# Patient Record
Sex: Female | Born: 1965 | Race: White | Hispanic: No | Marital: Married | Smoking: Current every day smoker
Health system: Southern US, Community
[De-identification: ages and names within clinical notes are randomized; demographics above are authoritative.]

## PROBLEM LIST (undated history)

## (undated) DIAGNOSIS — Z0000 Encounter for general adult medical examination without abnormal findings: Secondary | ICD-10-CM

## (undated) DIAGNOSIS — L301 Dyshidrosis [pompholyx]: Secondary | ICD-10-CM

## (undated) DIAGNOSIS — M797 Fibromyalgia: Secondary | ICD-10-CM

## (undated) DIAGNOSIS — I10 Essential (primary) hypertension: Secondary | ICD-10-CM

## (undated) DIAGNOSIS — Z862 Personal history of diseases of the blood and blood-forming organs and certain disorders involving the immune mechanism: Secondary | ICD-10-CM

## (undated) DIAGNOSIS — G2581 Restless legs syndrome: Secondary | ICD-10-CM

## (undated) DIAGNOSIS — M1990 Unspecified osteoarthritis, unspecified site: Secondary | ICD-10-CM

## (undated) DIAGNOSIS — L509 Urticaria, unspecified: Secondary | ICD-10-CM

## (undated) DIAGNOSIS — F329 Major depressive disorder, single episode, unspecified: Secondary | ICD-10-CM

## (undated) HISTORY — DX: Restless legs syndrome: G25.81

## (undated) HISTORY — PX: CHOLECYSTECTOMY: SHX55

## (undated) HISTORY — PX: TUBAL LIGATION: SHX77

## (undated) HISTORY — DX: Major depressive disorder, single episode, unspecified: F32.9

## (undated) HISTORY — DX: Urticaria, unspecified: L50.9

## (undated) HISTORY — DX: Encounter for general adult medical examination without abnormal findings: Z00.00

## (undated) HISTORY — DX: Essential (primary) hypertension: I10

## (undated) HISTORY — DX: Personal history of diseases of the blood and blood-forming organs and certain disorders involving the immune mechanism: Z86.2

## (undated) HISTORY — DX: Dyshidrosis (pompholyx): L30.1

## (undated) HISTORY — DX: Fibromyalgia: M79.7

## (undated) HISTORY — DX: Unspecified osteoarthritis, unspecified site: M19.90

---

## 2001-03-11 ENCOUNTER — Emergency Department (HOSPITAL_COMMUNITY): Admission: EM | Admit: 2001-03-11 | Discharge: 2001-03-11 | Payer: Self-pay | Admitting: Emergency Medicine

## 2006-10-27 ENCOUNTER — Emergency Department (HOSPITAL_COMMUNITY): Admission: EM | Admit: 2006-10-27 | Discharge: 2006-10-27 | Payer: Self-pay | Admitting: Family Medicine

## 2010-01-03 ENCOUNTER — Emergency Department (HOSPITAL_COMMUNITY): Admission: EM | Admit: 2010-01-03 | Discharge: 2010-01-03 | Payer: Self-pay | Admitting: Emergency Medicine

## 2011-01-09 LAB — URINALYSIS, ROUTINE W REFLEX MICROSCOPIC
Specific Gravity, Urine: 1.027 (ref 1.005–1.030)
Urobilinogen, UA: 1 mg/dL (ref 0.0–1.0)

## 2011-01-09 LAB — CBC
HCT: 41.6 % (ref 36.0–46.0)
Hemoglobin: 13.9 g/dL (ref 12.0–15.0)
MCHC: 33.4 g/dL (ref 30.0–36.0)
MCV: 102.6 fL — ABNORMAL HIGH (ref 78.0–100.0)
Platelets: 176 10*3/uL (ref 150–400)
RBC: 4.06 MIL/uL (ref 3.87–5.11)
RDW: 16 % — ABNORMAL HIGH (ref 11.5–15.5)
WBC: 7.8 10*3/uL (ref 4.0–10.5)

## 2011-01-09 LAB — POCT I-STAT, CHEM 8
Calcium, Ion: 1.15 mmol/L (ref 1.12–1.32)
Glucose, Bld: 101 mg/dL — ABNORMAL HIGH (ref 70–99)
Hemoglobin: 10.9 g/dL — ABNORMAL LOW (ref 12.0–15.0)
Potassium: 3.7 mEq/L (ref 3.5–5.1)

## 2011-01-09 LAB — DIFFERENTIAL
Eosinophils Relative: 0 % (ref 0–5)
Lymphocytes Relative: 25 % (ref 12–46)
Lymphs Abs: 1.9 10*3/uL (ref 0.7–4.0)
Neutrophils Relative %: 71 % (ref 43–77)

## 2011-04-19 ENCOUNTER — Emergency Department (HOSPITAL_COMMUNITY)
Admission: EM | Admit: 2011-04-19 | Discharge: 2011-04-19 | Disposition: A | Discharge status: Home / Self Care | Payer: Self-pay | Attending: Emergency Medicine | Admitting: Emergency Medicine

## 2011-04-19 DIAGNOSIS — L299 Pruritus, unspecified: Secondary | ICD-10-CM | POA: Insufficient documentation

## 2011-04-19 DIAGNOSIS — M129 Arthropathy, unspecified: Secondary | ICD-10-CM | POA: Insufficient documentation

## 2011-04-19 DIAGNOSIS — L259 Unspecified contact dermatitis, unspecified cause: Secondary | ICD-10-CM | POA: Insufficient documentation

## 2011-04-19 DIAGNOSIS — Z79899 Other long term (current) drug therapy: Secondary | ICD-10-CM | POA: Insufficient documentation

## 2011-04-19 DIAGNOSIS — IMO0001 Reserved for inherently not codable concepts without codable children: Secondary | ICD-10-CM | POA: Insufficient documentation

## 2011-04-19 DIAGNOSIS — I10 Essential (primary) hypertension: Secondary | ICD-10-CM | POA: Insufficient documentation

## 2011-04-19 DIAGNOSIS — M79609 Pain in unspecified limb: Secondary | ICD-10-CM | POA: Insufficient documentation

## 2011-04-19 DIAGNOSIS — M7989 Other specified soft tissue disorders: Secondary | ICD-10-CM | POA: Insufficient documentation

## 2011-04-21 ENCOUNTER — Emergency Department (HOSPITAL_COMMUNITY)
Admission: EM | Admit: 2011-04-21 | Discharge: 2011-04-21 | Disposition: A | Discharge status: Home / Self Care | Payer: Self-pay | Attending: Emergency Medicine | Admitting: Emergency Medicine

## 2011-04-21 DIAGNOSIS — L259 Unspecified contact dermatitis, unspecified cause: Secondary | ICD-10-CM | POA: Insufficient documentation

## 2011-04-21 DIAGNOSIS — IMO0001 Reserved for inherently not codable concepts without codable children: Secondary | ICD-10-CM | POA: Insufficient documentation

## 2011-04-21 DIAGNOSIS — R21 Rash and other nonspecific skin eruption: Secondary | ICD-10-CM | POA: Insufficient documentation

## 2011-04-21 DIAGNOSIS — L298 Other pruritus: Secondary | ICD-10-CM | POA: Insufficient documentation

## 2011-04-21 DIAGNOSIS — L2989 Other pruritus: Secondary | ICD-10-CM | POA: Insufficient documentation

## 2011-04-21 DIAGNOSIS — I10 Essential (primary) hypertension: Secondary | ICD-10-CM | POA: Insufficient documentation

## 2011-04-21 DIAGNOSIS — L509 Urticaria, unspecified: Secondary | ICD-10-CM | POA: Insufficient documentation

## 2011-05-15 ENCOUNTER — Emergency Department (HOSPITAL_COMMUNITY)
Admission: EM | Admit: 2011-05-15 | Discharge: 2011-05-15 | Disposition: A | Discharge status: Home / Self Care | Payer: Self-pay | Attending: Emergency Medicine | Admitting: Emergency Medicine

## 2011-05-15 ENCOUNTER — Emergency Department (HOSPITAL_COMMUNITY): Payer: Self-pay

## 2011-05-15 DIAGNOSIS — L259 Unspecified contact dermatitis, unspecified cause: Secondary | ICD-10-CM | POA: Insufficient documentation

## 2011-05-15 DIAGNOSIS — I10 Essential (primary) hypertension: Secondary | ICD-10-CM | POA: Insufficient documentation

## 2011-05-15 DIAGNOSIS — X500XXA Overexertion from strenuous movement or load, initial encounter: Secondary | ICD-10-CM | POA: Insufficient documentation

## 2011-05-15 DIAGNOSIS — M25579 Pain in unspecified ankle and joints of unspecified foot: Secondary | ICD-10-CM | POA: Insufficient documentation

## 2011-05-15 DIAGNOSIS — S93409A Sprain of unspecified ligament of unspecified ankle, initial encounter: Secondary | ICD-10-CM | POA: Insufficient documentation

## 2011-06-23 ENCOUNTER — Encounter: Payer: Self-pay | Admitting: Family Medicine

## 2011-06-23 DIAGNOSIS — I10 Essential (primary) hypertension: Secondary | ICD-10-CM

## 2011-06-23 DIAGNOSIS — M797 Fibromyalgia: Secondary | ICD-10-CM | POA: Insufficient documentation

## 2011-06-23 DIAGNOSIS — M1990 Unspecified osteoarthritis, unspecified site: Secondary | ICD-10-CM | POA: Insufficient documentation

## 2011-06-23 DIAGNOSIS — G2581 Restless legs syndrome: Secondary | ICD-10-CM | POA: Insufficient documentation

## 2011-06-23 HISTORY — DX: Essential (primary) hypertension: I10

## 2011-06-24 ENCOUNTER — Ambulatory Visit (INDEPENDENT_AMBULATORY_CARE_PROVIDER_SITE_OTHER): Payer: Self-pay | Admitting: Family Medicine

## 2011-06-24 ENCOUNTER — Encounter: Payer: Self-pay | Admitting: Family Medicine

## 2011-06-24 DIAGNOSIS — IMO0001 Reserved for inherently not codable concepts without codable children: Secondary | ICD-10-CM

## 2011-06-24 DIAGNOSIS — M129 Arthropathy, unspecified: Secondary | ICD-10-CM

## 2011-06-24 DIAGNOSIS — F3289 Other specified depressive episodes: Secondary | ICD-10-CM

## 2011-06-24 DIAGNOSIS — M797 Fibromyalgia: Secondary | ICD-10-CM

## 2011-06-24 DIAGNOSIS — L259 Unspecified contact dermatitis, unspecified cause: Secondary | ICD-10-CM

## 2011-06-24 DIAGNOSIS — G2581 Restless legs syndrome: Secondary | ICD-10-CM

## 2011-06-24 DIAGNOSIS — L309 Dermatitis, unspecified: Secondary | ICD-10-CM

## 2011-06-24 DIAGNOSIS — F32A Depression, unspecified: Secondary | ICD-10-CM

## 2011-06-24 DIAGNOSIS — F329 Major depressive disorder, single episode, unspecified: Secondary | ICD-10-CM

## 2011-06-24 DIAGNOSIS — I10 Essential (primary) hypertension: Secondary | ICD-10-CM

## 2011-06-24 DIAGNOSIS — L301 Dyshidrosis [pompholyx]: Secondary | ICD-10-CM

## 2011-06-24 DIAGNOSIS — M1990 Unspecified osteoarthritis, unspecified site: Secondary | ICD-10-CM

## 2011-06-24 HISTORY — DX: Depression, unspecified: F32.A

## 2011-06-24 HISTORY — DX: Dyshidrosis (pompholyx): L30.1

## 2011-06-24 MED ORDER — AMITRIPTYLINE HCL 25 MG PO TABS: 25.0000 mg | ORAL_TABLET | Freq: Every day | ORAL | Status: DC

## 2011-06-24 MED ORDER — TRIAMCINOLONE ACETONIDE 0.5 % EX OINT: TOPICAL_OINTMENT | Freq: Two times a day (BID) | CUTANEOUS | Status: DC

## 2011-06-24 MED ORDER — LISINOPRIL-HYDROCHLOROTHIAZIDE 20-12.5 MG PO TABS: 1.0000 | ORAL_TABLET | Freq: Every day | ORAL | Status: DC

## 2011-06-24 NOTE — Assessment & Plan Note (Signed)
Will treat with TCA (amitriptyline) as patient has been out of cymbalta and it is too expensive for her.  Will also benefit fibromyalgia. Consider phq9 at next visit.

## 2011-06-24 NOTE — Assessment & Plan Note (Signed)
KOH scraping negative for hyphae or other signs of fungal infection. Will treat with Triamcinolone Ointment twice a day with gloves overnight to keep moisture in. Protective barrier for hands and feet in the daytime.will see back in 1 month.

## 2011-06-24 NOTE — Assessment & Plan Note (Addendum)
Well controlled with occasional carbidopa/levodopa. Will continue for now-no refills needed.   Addendum 06/27/11 7:20 pm: spoke with patient during her son's appointment today and she said she is out of her carbidopa/levodopa. Will refill at this time.

## 2011-06-24 NOTE — Assessment & Plan Note (Addendum)
Primarily in knees-occasionally uses OTC to give relief but most of the time just deals with pain.

## 2011-06-24 NOTE — Patient Instructions (Addendum)
It was a pleasure to meet you today! I have refilled your blood pressure medication. We have started you on an alternative to Cymbalta to try to help with depression and fibromyalgia.   I am sorry you are having so much pain with your rash. Please use the Triamcinolone Ointment twice a day. Be sure to use gloves overnight to keep moisture in. Use a protective barrier for your hands and feet in the daytime. We would like to see you back in 1 month.   Please try to obtain records from [facility] and have them sent to our office in the next week.   Please try to exercise 4-5 days a week for up to 30 minutes as tolerated. Going for a walk with your husband is a great idea!

## 2011-06-24 NOTE — Assessment & Plan Note (Addendum)
Treated with Cymbalta in the past (currently out-patient given samples previously) but patient cannot afford. Will switch to Amitriptyline given patient's difficulty sleeping-start at 25mg  and slowly titrate up.  Patient also states she was on tramadol at previous clinic. Will not refill at this time given change to TCA. Will await records to make determination on tramadol.   Also advised exercize 4-5x/wk (walking ok) as this is an effective fibromyalgia treatment.

## 2011-06-24 NOTE — Assessment & Plan Note (Signed)
Will refill lisinopril/hctz. Recheck blood pressure in a month as patient has been off of medications.

## 2011-06-24 NOTE — Progress Notes (Signed)
  Subjective:    Patient ID: Traci Guzman, female    DOB: 12-Oct-1966, 45 y.o.   MRN: [id number]  HPI Patient is 45 year old female with a history of HTN, Fibromyalgia, and dyshidrotic eczema  presenting as a new patient. Patient is also here for refills as she has been out of blood pressure and depression medications.   1. The patient's primary complaint is worsening of her eczema on her palms and soles. She says that over the course of the summer, her skin has been particularly sensitive. An area will itch without a rash then she will scratch it. After this time, the area becomes erythematous with some hives. The skin on her palms and soles has been sensitive as well. The plaques from her dyshidrotic eczema swell and become very painful (10/10 pain in her feet today).She describes the pain as her "skin being on fire". It is always present when the rash is present/inflamed.  She says she thought the worsening rashes/itching were poison oak. She was treated at [facility] with prednisone and it went completely away a month or two ago. She says 3 weeks ago she quit eating gluten and that things have improved slightly since then. Benadryl 4x/day has not been that beneficial. 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 She says she has had thedyshidrotic eczema since that time but not this bad of a flare previously.   2. HTN-has been off medications for a month or so. Needs a refill on Lisinopril/HCTZ. Says well controlled on this regimen. Denies HA, CP, SOB, edema.   3. Depression/-patient says she cannot afford Cymbalta and would like a cheaper option that will help both with her depression and fibromyalgia.   Past medical, social, family history-reviewed and documented. Currently not smoking-quit 08  Review of Systems-see HPI     Objective:   Physical Exam  Vitals reviewed. Constitutional: She is oriented to person, place, and time. She appears  well-developed.  HENT:  Head: Normocephalic and atraumatic.  Nose: Nose normal.  Mouth/Throat: Oropharynx is clear and moist. No oropharyngeal exudate.  Eyes: Conjunctivae and EOM are normal. Pupils are equal, round, and reactive to light. Right eye exhibits no discharge. Left eye exhibits no discharge. No scleral icterus.  Neck: Normal range of motion. Neck supple.  Cardiovascular: Normal rate, regular rhythm, normal heart sounds and intact distal pulses.  Exam reveals no gallop and no friction rub.   No murmur heard. Pulmonary/Chest: Effort normal and breath sounds normal. No respiratory distress. She has no wheezes. She has no rales. She exhibits no tenderness.  Abdominal: Soft. Bowel sounds are normal. She exhibits no distension. There is no tenderness. There is no rebound and no guarding.  Musculoskeletal: Normal range of motion.  Neurological: She is alert and oriented to person, place, and time.  Skin: Skin is warm and dry.  Psychiatric: She has a normal mood and affect. Her behavior is normal.          Assessment & Plan:

## 2011-06-27 MED ORDER — CARBIDOPA-LEVODOPA 25-100 MG PO TABS: 1.0000 | ORAL_TABLET | Freq: Every evening | ORAL | Status: DC | PRN

## 2011-06-27 NOTE — Progress Notes (Signed)
Addended by: Shelva Majestic on: 06/27/2011 07:24 PM   Modules accepted: Orders

## 2011-08-10 ENCOUNTER — Ambulatory Visit (INDEPENDENT_AMBULATORY_CARE_PROVIDER_SITE_OTHER): Payer: Self-pay | Admitting: Family Medicine

## 2011-08-10 ENCOUNTER — Encounter: Payer: Self-pay | Admitting: Family Medicine

## 2011-08-10 VITALS — BP 102/73 | HR 101 | Ht 66.0 in | Wt 223.0 lb

## 2011-08-10 DIAGNOSIS — L509 Urticaria, unspecified: Secondary | ICD-10-CM | POA: Insufficient documentation

## 2011-08-10 DIAGNOSIS — M797 Fibromyalgia: Secondary | ICD-10-CM

## 2011-08-10 DIAGNOSIS — IMO0001 Reserved for inherently not codable concepts without codable children: Secondary | ICD-10-CM

## 2011-08-10 DIAGNOSIS — R21 Rash and other nonspecific skin eruption: Secondary | ICD-10-CM

## 2011-08-10 HISTORY — DX: Urticaria, unspecified: L50.9

## 2011-08-10 MED ORDER — PREDNISONE (PAK) 10 MG PO TABS: 10.0000 mg | ORAL_TABLET | Freq: Every day | ORAL | Status: AC

## 2011-08-10 NOTE — Patient Instructions (Signed)
I would like to try the steroids to see if this helps your rash.  I would like for you to follow up in one week to see if this is improving.  I would like for you to take 50mg  of amitriptyline at bedtime to see if this helps to control your pain better.  If you feel like your rash is worsening please give [place] a call back sooner.

## 2011-08-10 NOTE — Progress Notes (Signed)
  Subjective:    Patient ID: Traci Guzman, female    DOB: 08-03-1966, 45 y.o.   MRN: [id number]  HPI 45yo female who continues to complain of a rash all over her body. She states that the rash started 3 months ago her feet and has continued to spread randomly on her body. She does not remember anything significant occuring when the rash appeared. The rash itches and causes a piercing pain. It also comes and goes. She also states that a new rash has appeared on her right arm since sitting in the examining room. She has had no changes in food or ingredients other than cutting out gluten 1 month ago. She has no new detergents or soaps.  She reports no history of travel. She has started amitriptyline and discontinued cymbalta 1 month ago, but no other medication changes. No one in her household has experienced the rash. She denies any recent travel. She denies fever, chills, headache, joint pain. She has tried benadryl, and various NSAIDS without relief. She also states that her eczema has improved on her hands and feet since starting Kenalog She states that her fibromyalgia is not under control and would consider increasing amytriptyline   Review of Systems See HPI    Objective:   Physical Exam General: Cooperative, well appearing, NAD Chest: S1, S2 heard, RRR Lungs: CTAB Skin: Hive-like raised rash on the medial aspect of upper right arm and elbow over medial epicondyle, faded rash on her back. Dry eczematous patches on her palms and soles. Tender feet at dorsal aspect, and heel. No swelling noted       Assessment & Plan:

## 2011-08-15 NOTE — Assessment & Plan Note (Addendum)
Rash looks to be urticarial, given itchiness and waxing and waning nature, unsure of etiology.  Does not look to be shingles. No new foods, spices, travel or changes in detergents.  Will treat with prednisone taper, to see if this improves.  Given red flags, will come back if not improving.

## 2011-08-15 NOTE — Assessment & Plan Note (Signed)
Does not seem to have good control, still with complaints of pain in her feet.  Will increase amitriptyline to see if this helps better control her symptoms.

## 2011-08-17 ENCOUNTER — Ambulatory Visit: Payer: Self-pay | Admitting: Family Medicine

## 2011-08-23 ENCOUNTER — Telehealth: Payer: Self-pay | Admitting: Family Medicine

## 2011-08-23 DIAGNOSIS — M797 Fibromyalgia: Secondary | ICD-10-CM

## 2011-08-23 MED ORDER — AMITRIPTYLINE HCL 50 MG PO TABS: ORAL_TABLET | ORAL | Status: DC

## 2011-08-23 NOTE — Telephone Encounter (Signed)
Consulted with Dr. Jennette Kettle and she advises may send in Amitriptyline 50 mg tab . Patient has follow up appointment 08/31/2011  Patient notified.

## 2011-08-23 NOTE — Telephone Encounter (Signed)
Pt was here a few weeks ago and Dr Ashley Royalty increased her Amitriptilyne to 50mg .  She is now out b/c the pharmacy will not give refill on the old one.  Needs to know what to do. [facility] [address]

## 2011-08-31 ENCOUNTER — Encounter: Payer: Self-pay | Admitting: Family Medicine

## 2011-08-31 ENCOUNTER — Ambulatory Visit (INDEPENDENT_AMBULATORY_CARE_PROVIDER_SITE_OTHER): Payer: Self-pay | Admitting: Family Medicine

## 2011-08-31 VITALS — BP 130/78 | HR 96 | Temp 98.5°F | Ht 66.0 in | Wt 218.7 lb

## 2011-08-31 DIAGNOSIS — L509 Urticaria, unspecified: Secondary | ICD-10-CM

## 2011-08-31 DIAGNOSIS — L301 Dyshidrosis [pompholyx]: Secondary | ICD-10-CM

## 2011-08-31 DIAGNOSIS — F329 Major depressive disorder, single episode, unspecified: Secondary | ICD-10-CM

## 2011-08-31 DIAGNOSIS — IMO0001 Reserved for inherently not codable concepts without codable children: Secondary | ICD-10-CM

## 2011-08-31 DIAGNOSIS — I10 Essential (primary) hypertension: Secondary | ICD-10-CM

## 2011-08-31 DIAGNOSIS — Z862 Personal history of diseases of the blood and blood-forming organs and certain disorders involving the immune mechanism: Secondary | ICD-10-CM

## 2011-08-31 DIAGNOSIS — Z23 Encounter for immunization: Secondary | ICD-10-CM

## 2011-08-31 DIAGNOSIS — M797 Fibromyalgia: Secondary | ICD-10-CM

## 2011-08-31 HISTORY — DX: Personal history of diseases of the blood and blood-forming organs and certain disorders involving the immune mechanism: Z86.2

## 2011-08-31 LAB — BASIC METABOLIC PANEL
BUN: 11 mg/dL (ref 6–23)
CO2: 30 mEq/L (ref 19–32)
Chloride: 102 mEq/L (ref 96–112)
Potassium: 4.4 mEq/L (ref 3.5–5.3)

## 2011-08-31 LAB — CBC
MCH: 28 pg (ref 26.0–34.0)
MCHC: 32 g/dL (ref 30.0–36.0)
MCV: 87.5 fL (ref 78.0–100.0)
Platelets: 350 10*3/uL (ref 150–400)
RBC: 4.32 MIL/uL (ref 3.87–5.11)
RDW: 15.9 % — ABNORMAL HIGH (ref 11.5–15.5)
WBC: 7.7 10*3/uL (ref 4.0–10.5)

## 2011-08-31 LAB — VITAMIN B12: Vitamin B-12: 662 pg/mL (ref 211–911)

## 2011-08-31 MED ORDER — GABAPENTIN 300 MG PO CAPS: 300.0000 mg | ORAL_CAPSULE | Freq: Every day | ORAL | Status: DC

## 2011-08-31 NOTE — Patient Instructions (Signed)
It was good to see you again, Traci Guzman.  To review what we talked about:  1. I will place a referral for dermatology for you, sometimes these take a while on the orange card.   2. You got your flu and tetanus shot.   3. Please try to obtain records for you and your husband.   4. I am going to prescribe you a new medication for your pain. Gabapentin. I want you to take 1 pill at night for 2 weeks then increase to taking it in the morning and evening.   -the best thing for your fibromyalgia is going to exercise so please try to walk with your husband.   5. I am going to check several labs on you as well today.   6. You can schedule a follow up when your husband follows up and we can talk about your lab results at that time.   Thanks, Dr. Durene Cal

## 2011-09-01 ENCOUNTER — Encounter: Payer: Self-pay | Admitting: Family Medicine

## 2011-09-01 MED ORDER — CETIRIZINE HCL 10 MG PO CHEW: 10.0000 mg | CHEWABLE_TABLET | Freq: Every day | ORAL | Status: DC

## 2011-09-01 NOTE — Assessment & Plan Note (Addendum)
CBC with hgb 12.1 and b12 wnl.

## 2011-09-01 NOTE — Assessment & Plan Note (Signed)
Moderate control today at 130/78. Cr 0.77 Estimated Creatinine Clearance: 105.6 ml/min (by C-G formula based on Cr of 0.77).

## 2011-09-01 NOTE — Assessment & Plan Note (Addendum)
Does not seem to have good control even with increase amitriptyline to 50. Will start amitriptyline and titrate up. 300mg  qhs 2 weeks then BID. F/u in 1 month.   Attempting to control pain so patient can exercise which will be the #1 thing that can help this patient.

## 2011-09-01 NOTE — Assessment & Plan Note (Signed)
Improved with gluten free diet. Minimally with triamcinolone cream. Patient to continue gluten free diet. Should continue to moisturize hands and feet.

## 2011-09-01 NOTE — Assessment & Plan Note (Addendum)
Had told patient that we would place dermatology referral. After patient left, realized we have not yet tried an antihistamine. Will place an order for cetirizine h1 antihistamineas this is first line for chronic urticaria per uptodate. Will see back in 1 month and consider dermatology referral or further advancing therapy at that time. Called patient to update on plan and on lab work.

## 2011-09-01 NOTE — Assessment & Plan Note (Signed)
On Amitriptyline. Previously on cymbalta. Needs phq9 next visit so we can trend.

## 2011-09-01 NOTE — Progress Notes (Signed)
  Subjective:    Patient ID: Traci Guzman, female    DOB: 08-Feb-1966, 45 y.o.   MRN: [id number]  HPI Patient is 45 year old female with a history of HTN, Fibromyalgia, depression, dyshidrotic eczema, and urticarial rash  presenting for follow up of skin complaints.  1. Dyshidrotic eczema on palms and soles-patient tried triamcinolone cream in gloves overnight with very little effect. She then tried a gluten free diet which she says improved her discomfort/pain/dryness by 90%. The patient's primary complaint is worsening of her eczema on her palms and soles.   2. Urticarial Rash-Has been going on for almost 6 months, since the beginning of the summer. When I saw the patient last time, An area would itch without rash, she would scratch it then the area becomes erythematous with some hives. She was treated at [facility] with prednisone and it went completely several months ago. She was also treated by our clinic with a prednisone burst which completely improved her rash/pain. Benadryl helps somewhat with itching. Since the 2nd prednisone taper, the patient's urticarial rash has been primarily on her trunk (previously on arms and legs) .  She describes the pain as her "skin being on fire" with pain 6/10. Gluten free diet did not help this rash as much as dyshidrotic eczema.   3. Fibromyalgia-continued pain (low back, mid back, shoulder blades, neck, Left hip)despite starting amitriptyline and titration up to 50mg  at last visit. Interested in  gabapentin 300mg . Has not exercised as prescribed due to pain.     4. Patient reported hx anemia and b12 deficiency. Also reports history of some borderline sugars. Fasting today.  Wants to know what her labs look like currently.   5. Health Maintenance-patient wants a flu shot. Willing to have a tdap. Had normal pap 2 years ago with no history of abnormal paps.   Past medical-Currently not smoking-quit 08  Review of Systems-see HPI     Objective:   Physical Exam   BP 130/78  Pulse 96  Temp(Src) 98.5 F (36.9 C) (Oral)  Ht 5\' 6"  (1.676 m)  Wt 218 lb 11.2 oz (99.202 kg)  BMI 35.30 kg/m2  LMP 08/22/2011 Gen:  NAD HEENT: moist mucous membranes CV: Regular rate and rhythm, no murmurs rubs or gallops PULM: clear to auscultation bilaterally. No wheezes/rales/rhonchi ABD: soft/nontender/nondistended/normal bowel sounds EXT: No edema Skin: Hive-like raised rash band like below bra line. Dermatographism present on right arm after patient scratched with hive like appearance.  Dry eczematous patches on her palms and soles improved from previous without cracking or blistering. Neuro: Alert and oriented x3    Assessment & Plan:

## 2011-09-23 ENCOUNTER — Encounter: Payer: Self-pay | Admitting: Family Medicine

## 2011-09-23 ENCOUNTER — Ambulatory Visit (INDEPENDENT_AMBULATORY_CARE_PROVIDER_SITE_OTHER): Payer: Self-pay | Admitting: Family Medicine

## 2011-09-23 ENCOUNTER — Other Ambulatory Visit: Payer: Self-pay | Admitting: Family Medicine

## 2011-09-23 VITALS — BP 146/82 | HR 93 | Temp 97.8°F | Ht 66.0 in | Wt 220.0 lb

## 2011-09-23 DIAGNOSIS — M797 Fibromyalgia: Secondary | ICD-10-CM

## 2011-09-23 DIAGNOSIS — IMO0001 Reserved for inherently not codable concepts without codable children: Secondary | ICD-10-CM

## 2011-09-23 DIAGNOSIS — I10 Essential (primary) hypertension: Secondary | ICD-10-CM

## 2011-09-23 DIAGNOSIS — F32A Depression, unspecified: Secondary | ICD-10-CM

## 2011-09-23 DIAGNOSIS — L509 Urticaria, unspecified: Secondary | ICD-10-CM

## 2011-09-23 DIAGNOSIS — L301 Dyshidrosis [pompholyx]: Secondary | ICD-10-CM

## 2011-09-23 DIAGNOSIS — F329 Major depressive disorder, single episode, unspecified: Secondary | ICD-10-CM

## 2011-09-23 MED ORDER — AMITRIPTYLINE HCL 75 MG PO TABS: ORAL_TABLET | ORAL | Status: DC

## 2011-09-23 MED ORDER — MELOXICAM 15 MG PO TABS: 15.0000 mg | ORAL_TABLET | Freq: Every day | ORAL | Status: DC

## 2011-09-23 NOTE — Progress Notes (Signed)
  Subjective:    Patient ID: Traci Guzman, female    DOB: 11-Feb-1966, 45 y.o.   MRN: [id number]  HPI  Patient is 45 year old female with a history of HTN, Fibromyalgia, depression, dyshidrotic eczema, and urticarial rash  presenting for follow up of multiple issues, primarily fibromyalgia  1. Dyshidrotic eczema on palms and soles-gluten free diet continues to help patient have minimal issue with eczema.   2. Urticarial Rash-much improved with cetirizine. Patient will occasionally have an irritated area on her arm but MUCH less extensive than previous. She is happy with the result. Gluten free diet helped this minimally.   3. Fibromyalgia-continued pain (low back, mid back, shoulder blades, neck, Left hip, now in right hand)-started gabapentin 300mg  qhs last visit but when tried to titrate to BID, patient became to tired during the day. Willing to increase amitriptyline. Also taking ibuprofen 800mg  q6 hours. Has been walking 15 minutes a day 3x per week.   4. Depression-amitriptyline/gabapentin combo has made the patient's sleep much improved. She says she sleeps through the night and does not feel groggy. She says overall her spirits have improved on current medication compared to cymbalta. PHQ9 of 6. 2 pts for overeating.   Past medical-Currently not smoking-quit 08  Review of Systems negative except as noted in HPI       Objective:   Physical Exam  BP 146/82  Pulse 93  Temp(Src) 97.8 F (36.6 C) (Oral)  Ht 5\' 6"  (1.676 m)  Wt 220 lb (99.791 kg)  BMI 35.51 kg/m2  LMP 08/22/2011 Gen:  NAD HEENT: moist mucous membranes CV: Regular rate and rhythm, no murmurs rubs or gallops PULM: clear to auscultation bilaterally. No wheezes/rales/rhonchi EXT: No edema. Moves x4.  Skin: No current signs of previous Hive-like. Dermatographism minimal. Dry eczematous patches on her palms and soles without cracking or blistering (improved from initial visit) Neuro: Alert and oriented x3      Assessment & Plan:

## 2011-09-23 NOTE — Assessment & Plan Note (Signed)
Cetirizine with drastic improvement.-continue.

## 2011-09-23 NOTE — Assessment & Plan Note (Addendum)
On amitriptyline 50mg , now titrated to 75 mg. Continue to titrate up to 150mg .  Also gabapentin 300mg  qhs. Goal exercise 15 minutes 3x/wk. Also given mobic and told no more tylenol to ease regimen. Patient can also take tylenol.

## 2011-09-23 NOTE — Assessment & Plan Note (Signed)
Poor control today. Will recheck at next visit as previously well controlled and patient with 2 granddaughters that seemed to raise anxiety and pulse level.

## 2011-09-23 NOTE — Assessment & Plan Note (Signed)
phq9 score of 6 on 09/23/11. Patient reports improved spirits on amitriptyline as compared to previous cymbalta. Also more cost effective.

## 2011-09-23 NOTE — Assessment & Plan Note (Signed)
Continue gluten free diet as patient reports drastically helped.

## 2011-09-23 NOTE — Patient Instructions (Signed)
It was great to see you again , Ms. Traci Guzman.   I am sorry your fibromyalgia pains are continuing.  1. I am going to increase your amitriptyline to 75mg  (I am glad this is helping your depression as well). We will continue to increase this dosage until we have improvement in your symptoms. Continue your exercise walking 15 minutes per day 3 days per week.  2. I sent in a prescription for a pain medicine that you can take once a day. Do not take ibuprofen while taking this. You can take Tylenol.  3. We will not increase your gabapentin right now since it is making you sleepy during the day.   For your other problems: 1. I am glad your skin issues have improved so much. Plese continue your current medications.   I would like to see you in a month or two to see if we need to increase your amitriptyline more,  Dr. Durene Cal

## 2011-09-23 NOTE — Telephone Encounter (Signed)
Refill request

## 2011-10-03 ENCOUNTER — Telehealth: Payer: Self-pay | Admitting: Family Medicine

## 2011-10-03 NOTE — Telephone Encounter (Signed)
Ms. Traci Guzman is calling because she isn't sure if Dr. Durene Cal will want to see her for some of the symptoms like stiffness that the Mobic isn't working on.  It works great immflammation and joint, but not for muscle pain.

## 2011-10-03 NOTE — Telephone Encounter (Signed)
Patient states still having severe muscle pain with fibromyalgia. States she has had a few times where she has even sat down and cried from the pain.   Told patient to continue to take mobic and tylenol prn. Also will continue heating pad. Will also try ice packs.  Patient has had more relief with this than tylenol. Told her it would be a slow steady process of titrating up her amitriptyline. Patient expresses understanding.  At next visit, will titrate up and consider flexeril for pain control especially at night as this is a grade A recommendation from [employer].

## 2011-10-26 ENCOUNTER — Encounter: Payer: Self-pay | Admitting: Family Medicine

## 2011-10-26 ENCOUNTER — Ambulatory Visit (INDEPENDENT_AMBULATORY_CARE_PROVIDER_SITE_OTHER): Payer: Self-pay | Admitting: Family Medicine

## 2011-10-26 VITALS — BP 128/80 | HR 108 | Temp 98.0°F | Ht 66.0 in | Wt 219.0 lb

## 2011-10-26 DIAGNOSIS — I10 Essential (primary) hypertension: Secondary | ICD-10-CM

## 2011-10-26 DIAGNOSIS — L509 Urticaria, unspecified: Secondary | ICD-10-CM

## 2011-10-26 DIAGNOSIS — F329 Major depressive disorder, single episode, unspecified: Secondary | ICD-10-CM

## 2011-10-26 DIAGNOSIS — M797 Fibromyalgia: Secondary | ICD-10-CM

## 2011-10-26 DIAGNOSIS — F32A Depression, unspecified: Secondary | ICD-10-CM

## 2011-10-26 DIAGNOSIS — IMO0001 Reserved for inherently not codable concepts without codable children: Secondary | ICD-10-CM

## 2011-10-26 DIAGNOSIS — L301 Dyshidrosis [pompholyx]: Secondary | ICD-10-CM

## 2011-10-26 MED ORDER — AMITRIPTYLINE HCL 100 MG PO TABS: ORAL_TABLET | ORAL | Status: DC

## 2011-10-26 MED ORDER — TRAMADOL HCL 50 MG PO TABS: 50.0000 mg | ORAL_TABLET | Freq: Three times a day (TID) | ORAL | Status: DC | PRN

## 2011-10-26 MED ORDER — CETIRIZINE HCL 10 MG PO CHEW: 10.0000 mg | CHEWABLE_TABLET | Freq: Two times a day (BID) | ORAL | Status: DC

## 2011-10-26 NOTE — Assessment & Plan Note (Signed)
Will titrate amitriptyline up to 100 mg. Will also add tramadol 50 mg 3 times a day when necessary. Patient to continue Mobic. Also Tylenol when necessary. Encouraged patient to pick up activity level again back to at least 15 minutes per day 3 times a week of exercise.

## 2011-10-26 NOTE — Progress Notes (Signed)
  Subjective:    Patient ID: Traci Guzman, female    DOB: 1965-10-23, 46 y.o.   MRN: [id number]  HPIPatient is 46 year old female with a history of HTN, Fibromyalgia, depression, dyshidrotic eczema, and urticarial rash  presenting for follow up of multiple issues, primarily fibromyalgia and worsening of urticarial rash  1. Dyshidrotic eczema on palms and soles-gluten free diet continues to help patient have minimal issue with eczema. Patient states she should have moisturized today but otherwise happy with progress.   2. Urticarial Rash-initially much improved with cetirizine. She says for 1 week she has had worsening of her symptoms with urticaria along her bra line and in other areas when irritated by contact.. She had an area on her left wrist which when it got a hive, it actually caused some joint pain. This also happened in her right ankle yesterday. No fever/chills/reported new sexual partners. Joint aches for less than 24 hours and only seem associated with urticarial rash.   3. Fibromyalgia-continued pain (low back, mid back, shoulder blades, neck, Left hip, now in right hand)-on gabapentin and amitriptyline now at 75mg . Also taking mobic and tylenol. Patient says pain level similar to last visit. She has not been exercising in the busy holiday season, previously had been walking 15 minutes a day 3x per week.   4. Depression-amitriptyline/gabapentin combo continues to help patient with sleep and mood. Says her spirits are bright despite her pain.    Past medical-Currently not smoking-quit 08   Review of Systemsnegative except as noted in HPI. Patient does report some cough, congestion, runny nose in recent days as well but not a major concern for patient.      Objective:   Physical Exam  Vitals reviewed. Constitutional: She is oriented to person, place, and time. She appears well-developed and well-nourished. No distress.  HENT:  Mouth/Throat: Oropharynx is clear and moist. No  oropharyngeal exudate.  Eyes: Pupils are equal, round, and reactive to light.  Cardiovascular: Normal rate and regular rhythm.  Exam reveals no gallop and no friction rub.   No murmur heard. Pulmonary/Chest: Effort normal and breath sounds normal. No respiratory distress. She has no wheezes. She has no rales. She exhibits no tenderness.  Abdominal: Soft. She exhibits no distension.  Musculoskeletal: Normal range of motion.       Some tenderness to palpation on soft tissues proximal to left wrist, some erythema also noted in this area. Also mild tenderness to palpation on dorsal aspect of right ankle associated with area of erythema. No decreased ROM in either joint.   Neurological: She is alert and oriented to person, place, and time.  Skin:       10x10 cm urticarial rash noted on LUE in fold of arm.           Assessment & Plan:

## 2011-10-26 NOTE — Assessment & Plan Note (Signed)
Most recent PhQ 9 of 6. Patient continues to be in good spirits. She is thrilled that she is able to sleep through the night with her medicines. No complaints at this time. Continue current plan.

## 2011-10-26 NOTE — Assessment & Plan Note (Addendum)
Worsened symptoms x 2 week. 2 areas overlying joints concerning patient for joint pain but seem associated with urticarial rash which has always been painful for patient.   Increased to cetirizine BID.   If not effective in future, start h2 antihistamine. Then, derm referral.

## 2011-10-26 NOTE — Patient Instructions (Addendum)
For your rash worsening in the last week, I would like for you to start taking your Cetirizine twice daily instead of once daily.   For your fibromyalgia, i have increased your amitriptyline to 100mg .   I would like to see you back in 1 month to see how you are doing with these. If you begin to have fevers, worsening joint pain, worsening rash while on this regimen, I would like for you to be seen sooner.   Your blood pressure was fine on recheck today, so we can just continue to follow that.   Sorry you aren't feeling well, Dr. Durene Cal

## 2011-10-26 NOTE — Assessment & Plan Note (Addendum)
Here for blood pressure follow up. Blood pressure not elevated on repeat exam once patient at rest. No side effects from medicine. Will continue current medicines.

## 2011-10-26 NOTE — Assessment & Plan Note (Signed)
Well-controlled on gluten-free diet. Still present on exam the patient is happy with her current results.

## 2011-11-04 ENCOUNTER — Other Ambulatory Visit: Payer: Self-pay | Admitting: Family Medicine

## 2011-11-04 NOTE — Telephone Encounter (Signed)
Refill request

## 2011-11-24 ENCOUNTER — Ambulatory Visit: Payer: Self-pay | Admitting: Family Medicine

## 2011-12-06 ENCOUNTER — Ambulatory Visit: Payer: Self-pay | Admitting: Family Medicine

## 2011-12-30 ENCOUNTER — Other Ambulatory Visit: Payer: Self-pay | Admitting: Family Medicine

## 2012-01-01 NOTE — Telephone Encounter (Signed)
Refilled Mobic for fibromyalgia.

## 2012-01-01 NOTE — Telephone Encounter (Signed)
Refill request

## 2012-01-11 ENCOUNTER — Telehealth: Payer: Self-pay | Admitting: Family Medicine

## 2012-01-11 DIAGNOSIS — M797 Fibromyalgia: Secondary | ICD-10-CM

## 2012-01-11 NOTE — Telephone Encounter (Signed)
Patient needs refill on Amitriptyline and was told by [facility] on [address] that she needs to see her MD first.  The first opening isn't until late April, she is scheduled and would like enough to last at least until the appt.  She is also requesting a refill on Tramadol.

## 2012-01-12 NOTE — Telephone Encounter (Signed)
Patient is calling back  °

## 2012-01-16 MED ORDER — AMITRIPTYLINE HCL 100 MG PO TABS: ORAL_TABLET | ORAL | Status: DC

## 2012-01-16 MED ORDER — TRAMADOL HCL 50 MG PO TABS: 50.0000 mg | ORAL_TABLET | Freq: Three times a day (TID) | ORAL | Status: DC | PRN

## 2012-01-16 NOTE — Telephone Encounter (Signed)
Refilled both Tramadol and Amitriptyline. Will ask admin to update patient as I was on vacation last week, thus the reason refills were delayed.

## 2012-01-16 NOTE — Telephone Encounter (Signed)
Patient is calling back because she hasn't heard anything about the refill yet.  Her pharmacy has attempted several times over the last several days.  She is also needing Tramadol.

## 2012-02-07 ENCOUNTER — Ambulatory Visit: Payer: Self-pay | Admitting: Family Medicine

## 2012-02-08 ENCOUNTER — Other Ambulatory Visit: Payer: Self-pay | Admitting: Family Medicine

## 2012-02-08 NOTE — Telephone Encounter (Signed)
Refilled gabapentin and lisinopril-hctz.

## 2012-02-20 ENCOUNTER — Encounter: Payer: Self-pay | Admitting: Family Medicine

## 2012-02-20 ENCOUNTER — Ambulatory Visit (HOSPITAL_COMMUNITY)
Admission: RE | Admit: 2012-02-20 | Discharge: 2012-02-20 | Disposition: A | Discharge status: Home / Self Care | Payer: Self-pay | Source: Ambulatory Visit | Attending: Family Medicine | Admitting: Family Medicine

## 2012-02-20 ENCOUNTER — Ambulatory Visit (INDEPENDENT_AMBULATORY_CARE_PROVIDER_SITE_OTHER): Payer: Self-pay | Admitting: Family Medicine

## 2012-02-20 VITALS — BP 132/79 | HR 105 | Temp 98.3°F | Ht 66.0 in | Wt 209.0 lb

## 2012-02-20 DIAGNOSIS — IMO0001 Reserved for inherently not codable concepts without codable children: Secondary | ICD-10-CM

## 2012-02-20 DIAGNOSIS — R002 Palpitations: Secondary | ICD-10-CM | POA: Insufficient documentation

## 2012-02-20 DIAGNOSIS — M129 Arthropathy, unspecified: Secondary | ICD-10-CM

## 2012-02-20 DIAGNOSIS — M1990 Unspecified osteoarthritis, unspecified site: Secondary | ICD-10-CM

## 2012-02-20 DIAGNOSIS — M797 Fibromyalgia: Secondary | ICD-10-CM

## 2012-02-20 DIAGNOSIS — E669 Obesity, unspecified: Secondary | ICD-10-CM

## 2012-02-20 HISTORY — DX: Palpitations: R00.2

## 2012-02-20 MED ORDER — AMITRIPTYLINE HCL 25 MG PO TABS: 25.0000 mg | ORAL_TABLET | Freq: Every day | ORAL | Status: DC

## 2012-02-20 MED ORDER — AMITRIPTYLINE HCL 100 MG PO TABS: ORAL_TABLET | ORAL | Status: DC

## 2012-02-20 NOTE — Patient Instructions (Addendum)
Dear Traci Guzman,   It was great to see you today. Thank you for coming to clinic. Please read below regarding the issues that we discussed.   1. For your heart palpitations, we did an EKG today which was normal. I would also like to check a thyroid level today. At this time, I do not see anything worrisome related to your exam or symptoms. Please continue to monitor.  2. For your fibromyalgia, we have increased your amitriptyline to 125mg  (take a 100mg  tablet and a 25 mg tablet each night). I am thrilled you are now able to get up and do so much walking. You can keep taking your tramadol, mobic, and tylenol as needed. Hopefully, we can get you off of Mobic at a future visit.  3. Great job on the exercise. You have lost 16 lbs since we first met. Keep up the great work.  4. Finally, your blood pressure was well controlled today.   Please follow up in clinic in 6 months . Please call earlier if you have any questions or concerns.   Sincerely,  Dr. Tana Conch  Serotonin Syndrome Serotonin is a brain chemical that regulates the nervous system. Some kinds of drugs increase the amount of serotonin in your body. Drugs that increase the serotonin in your body include:   Anti-depressant medications.   St. John's wort.   Recreational drugs.   Migraine medicines.   Some pain medicines.  SYMPTOMS Combining these drugs increases the risk that you will become ill with a toxic condition called serotonin syndrome.  Symptoms of too much serotonin include:  Confusion.   Agitation.   Weakness.   Insomnia.   Fever.   Sweats.  Other symptoms that may develop include:  Shakiness.   Muscle spasms.   Seizures.  TREATMENT  Hospital treatment is often needed until the effects are controlled.   Avoiding the combination of medicines listed above is recommended.   Check with your doctor if you are concerned about your medicine or the side effects.  Document Released: 11/10/2004  Document Revised: 09/22/2011 Document Reviewed: 10/03/2005 [facility] Patient Information 2012 [place], [place].

## 2012-02-21 DIAGNOSIS — E669 Obesity, unspecified: Secondary | ICD-10-CM | POA: Insufficient documentation

## 2012-02-21 NOTE — Assessment & Plan Note (Signed)
Encouraged patient to continue to exercise with walking each day.

## 2012-02-21 NOTE — Assessment & Plan Note (Signed)
EKG in office with normal sinus rhythm. No abnormal heart rhythm noted on my physical exam either. No red flags. Possibly related to anxiety/panic attacks given other co morbidities in patient. TSH ordered and came back slightly low. Plan to follow up with T3, T4 at next visit. Patient with weight loss but likely due to increased exercise not necessarily a hyperthyroid state.

## 2012-02-21 NOTE — Assessment & Plan Note (Signed)
Achieving exercise goal. Still with pain. APpears OA may be worst pain. Patient still with pain in regular fibromyalgia areas and ready to titrate up to 125mg . When see patient back, plan to titrate to max of 150. Hope to be able to wean Mobic at that time.

## 2012-02-21 NOTE — Assessment & Plan Note (Signed)
Per fibromyalgia regimen for treatment of knee pain. Mobic and tylenol prn.

## 2012-02-21 NOTE — Progress Notes (Signed)
  Subjective:    Patient ID: Traci Guzman, female    DOB: December 18, 1965, 46 y.o.   MRN: [id number]  HPI 1. Heart palpitations-says has had them for years but worse in recent months. Last 15-20 minutes and "stop her in her tracks" from worry but not from any physical symptom. No associated CP, SOB, neck pain, sweating, nausea, vomiting. Happens randomly-not particularly associated with exertion. Does admit to chest pain occasionally but says this lasts for about 1 second and goes away and not associated with above symptoms.   2. Fibromyalgia-continues to have pain in trouble areas (see most recent note for list of areas). Thinks pain in knees from OA likely worse than Fibromyalgia pain. ABle to get up and move now and is walking 15 minutes 5x per day when previously could not do it 3x per week. Patient thinks pain is slightly worse but that mobility is much better. SHe is excited about her progress.   3. Obesity-has lost 16 lbs since September. Also lifting light hand weights in addition to walking mentioned above.   4. HTN-well controlled. Compliant with meds. ROS essentially obtained in heart palpitations section with following addition-no edema, HA, blurry vision.   5. Recent URI/sinus symptoms-used antibiotics after several days of sinus pressure. Symptoms now resolving. COunseled against using nonprescription specific antibiotics in future.    Review of Systems -See HPI  Past Medical History-smoking status noted: former smoker. Reviewed problem list.  Medications- reviewed and updated Chief complaint-noted      Objective:   Physical Exam  Constitutional: She is oriented to person, place, and time. She appears well-developed and well-nourished. No distress.  HENT:  Head: Normocephalic and atraumatic.  Right Ear: Tympanic membrane and ear canal normal.  Left Ear: Tympanic membrane and ear canal normal.  Nose: Nose normal.  Mouth/Throat: Oropharynx is clear and moist. No oropharyngeal  exudate.       slight maxillary sinus tenderness on left side.   Eyes: Conjunctivae and EOM are normal. Pupils are equal, round, and reactive to light. Right eye exhibits no discharge. Left eye exhibits no discharge.  Neck: Normal range of motion. Neck supple.  Cardiovascular: Normal rate, regular rhythm and intact distal pulses.  Exam reveals no gallop and no friction rub.   No murmur heard. Pulmonary/Chest: Breath sounds normal. No respiratory distress. She has no wheezes. She has no rales.  Abdominal: Soft. Bowel sounds are normal.  Musculoskeletal: Normal range of motion. She exhibits no edema.  Lymphadenopathy:    She has no cervical adenopathy.  Neurological: She is alert and oriented to person, place, and time.  Skin: Skin is warm and dry.      Assessment & Plan:  Antibiotic misuse-patient recently with URI symptoms and she took 3 days of old antibiotics-advised patient against future use of antibiotics when not specifically prescribed for. Patient aware and ready to make change in future.

## 2012-02-27 ENCOUNTER — Telehealth: Payer: Self-pay | Admitting: Family Medicine

## 2012-02-27 DIAGNOSIS — R002 Palpitations: Secondary | ICD-10-CM

## 2012-02-27 NOTE — Telephone Encounter (Signed)
Pt informed of the below.    Jeannett Senior, Please put in a referral to [facility] for a heart monitor.  We have to send them over there to get these placed. Romney Compean, Maryjo Rochester

## 2012-02-27 NOTE — Telephone Encounter (Signed)
Left voicemail for patient to return call.   For nursing to relay: 1.  Thyroid levels slightly low (meaning high functioning thyroid) So I would like to repeat in 6 weeks.  2. Discussed with preceptors and would like to place a Holter monitor to see if we can capture any of the events due to Ms. Skowron's age and HTN.  I would like for her to remember how many events she has of palpitations and times while wearing Holter monitor.   Tana Conch, MD, PGY1 02/27/2012 11:00 AM

## 2012-02-27 NOTE — Telephone Encounter (Signed)
Addended by: Damita Lack on: 02/27/2012 02:40 PM   Modules accepted: Orders

## 2012-02-27 NOTE — Telephone Encounter (Signed)
Addended by: Shelva Majestic on: 02/27/2012 12:28 PM   Modules accepted: Orders

## 2012-03-26 ENCOUNTER — Other Ambulatory Visit: Payer: Self-pay | Admitting: *Deleted

## 2012-03-26 DIAGNOSIS — M797 Fibromyalgia: Secondary | ICD-10-CM

## 2012-03-26 MED ORDER — TRAMADOL HCL 50 MG PO TABS: 50.0000 mg | ORAL_TABLET | Freq: Three times a day (TID) | ORAL | Status: DC | PRN

## 2012-03-26 NOTE — Telephone Encounter (Signed)
Refilled tramadol.

## 2012-03-27 ENCOUNTER — Other Ambulatory Visit: Payer: Self-pay | Admitting: *Deleted

## 2012-03-27 DIAGNOSIS — M797 Fibromyalgia: Secondary | ICD-10-CM

## 2012-03-27 MED ORDER — GABAPENTIN 300 MG PO CAPS: 300.0000 mg | ORAL_CAPSULE | Freq: Every day | ORAL | Status: DC

## 2012-03-27 NOTE — Telephone Encounter (Signed)
Refilled gabapentin

## 2012-04-13 ENCOUNTER — Other Ambulatory Visit: Payer: Self-pay | Admitting: *Deleted

## 2012-04-13 DIAGNOSIS — I10 Essential (primary) hypertension: Secondary | ICD-10-CM

## 2012-04-13 DIAGNOSIS — M797 Fibromyalgia: Secondary | ICD-10-CM

## 2012-04-13 MED ORDER — LISINOPRIL-HYDROCHLOROTHIAZIDE 20-12.5 MG PO TABS: 1.0000 | ORAL_TABLET | Freq: Every day | ORAL | Status: DC

## 2012-04-13 NOTE — Telephone Encounter (Signed)
Refilled lisinopril/hctz

## 2012-04-13 NOTE — Telephone Encounter (Signed)
#  30 is only a 10 day supply per pharmacy.  Do you want to increase quantity?  Traci Guzman

## 2012-04-14 MED ORDER — TRAMADOL HCL 50 MG PO TABS: 50.0000 mg | ORAL_TABLET | Freq: Three times a day (TID) | ORAL | Status: DC | PRN

## 2012-04-14 NOTE — Telephone Encounter (Signed)
Refilled tramadol. Quantity to #90

## 2012-04-26 ENCOUNTER — Encounter (INDEPENDENT_AMBULATORY_CARE_PROVIDER_SITE_OTHER): Payer: Self-pay

## 2012-04-26 DIAGNOSIS — R002 Palpitations: Secondary | ICD-10-CM

## 2012-05-08 ENCOUNTER — Encounter: Payer: Self-pay | Admitting: Family Medicine

## 2012-07-16 ENCOUNTER — Other Ambulatory Visit: Payer: Self-pay | Admitting: Family Medicine

## 2012-08-29 ENCOUNTER — Other Ambulatory Visit: Payer: Self-pay | Admitting: Family Medicine

## 2012-08-30 ENCOUNTER — Other Ambulatory Visit: Payer: Self-pay | Admitting: *Deleted

## 2012-08-30 DIAGNOSIS — M797 Fibromyalgia: Secondary | ICD-10-CM

## 2012-08-30 MED ORDER — AMITRIPTYLINE HCL 100 MG PO TABS: ORAL_TABLET | ORAL | Status: DC

## 2012-09-04 ENCOUNTER — Other Ambulatory Visit: Payer: Self-pay | Admitting: Family Medicine

## 2012-10-01 ENCOUNTER — Other Ambulatory Visit: Payer: Self-pay | Admitting: Family Medicine

## 2012-10-01 ENCOUNTER — Other Ambulatory Visit: Payer: Self-pay | Admitting: *Deleted

## 2012-10-27 ENCOUNTER — Encounter (HOSPITAL_COMMUNITY): Payer: Self-pay | Admitting: Emergency Medicine

## 2012-10-27 ENCOUNTER — Emergency Department (HOSPITAL_COMMUNITY)
Admission: EM | Admit: 2012-10-27 | Discharge: 2012-10-27 | Disposition: A | Discharge status: Home / Self Care | Payer: Self-pay | Attending: Emergency Medicine | Admitting: Emergency Medicine

## 2012-10-27 ENCOUNTER — Emergency Department (HOSPITAL_COMMUNITY): Payer: Self-pay

## 2012-10-27 DIAGNOSIS — Y92009 Unspecified place in unspecified non-institutional (private) residence as the place of occurrence of the external cause: Secondary | ICD-10-CM | POA: Insufficient documentation

## 2012-10-27 DIAGNOSIS — Z862 Personal history of diseases of the blood and blood-forming organs and certain disorders involving the immune mechanism: Secondary | ICD-10-CM | POA: Insufficient documentation

## 2012-10-27 DIAGNOSIS — M79603 Pain in arm, unspecified: Secondary | ICD-10-CM

## 2012-10-27 DIAGNOSIS — M7918 Myalgia, other site: Secondary | ICD-10-CM

## 2012-10-27 DIAGNOSIS — X500XXA Overexertion from strenuous movement or load, initial encounter: Secondary | ICD-10-CM | POA: Insufficient documentation

## 2012-10-27 DIAGNOSIS — S46909A Unspecified injury of unspecified muscle, fascia and tendon at shoulder and upper arm level, unspecified arm, initial encounter: Secondary | ICD-10-CM | POA: Insufficient documentation

## 2012-10-27 DIAGNOSIS — S4980XA Other specified injuries of shoulder and upper arm, unspecified arm, initial encounter: Secondary | ICD-10-CM | POA: Insufficient documentation

## 2012-10-27 DIAGNOSIS — I10 Essential (primary) hypertension: Secondary | ICD-10-CM | POA: Insufficient documentation

## 2012-10-27 DIAGNOSIS — Z8739 Personal history of other diseases of the musculoskeletal system and connective tissue: Secondary | ICD-10-CM | POA: Insufficient documentation

## 2012-10-27 DIAGNOSIS — R29898 Other symptoms and signs involving the musculoskeletal system: Secondary | ICD-10-CM | POA: Insufficient documentation

## 2012-10-27 DIAGNOSIS — Z87891 Personal history of nicotine dependence: Secondary | ICD-10-CM | POA: Insufficient documentation

## 2012-10-27 DIAGNOSIS — Y9389 Activity, other specified: Secondary | ICD-10-CM | POA: Insufficient documentation

## 2012-10-27 DIAGNOSIS — Z79899 Other long term (current) drug therapy: Secondary | ICD-10-CM | POA: Insufficient documentation

## 2012-10-27 DIAGNOSIS — Z872 Personal history of diseases of the skin and subcutaneous tissue: Secondary | ICD-10-CM | POA: Insufficient documentation

## 2012-10-27 MED ORDER — OXYCODONE-ACETAMINOPHEN 5-325 MG PO TABS
1.0000 | ORAL_TABLET | Freq: Once | ORAL | Status: AC
  Administered 2012-10-27: 1 via ORAL
  Filled 2012-10-27: qty 1

## 2012-10-27 MED ORDER — CYCLOBENZAPRINE HCL 10 MG PO TABS: 10.0000 mg | ORAL_TABLET | Freq: Two times a day (BID) | ORAL | Status: DC | PRN

## 2012-10-27 MED ORDER — IBUPROFEN 400 MG PO TABS
800.0000 mg | ORAL_TABLET | Freq: Once | ORAL | Status: AC
  Administered 2012-10-27: 800 mg via ORAL
  Filled 2012-10-27: qty 2

## 2012-10-27 NOTE — ED Provider Notes (Signed)
History   This chart was scribed for non-physician practitioner working with Hurman Horn, MD by Smitty Pluck. This patient was seen in room TR07C/TR07C and the patient's care was started at 9:53 PM.   CSN: [id number]  Arrival date & time 10/27/12  1926       Chief Complaint  Patient presents with  . Arm Injury    (Consider location/radiation/quality/duration/timing/severity/associated sxs/prior treatment) Patient is a 47 y.o. female presenting with arm injury. The history is provided by the patient. No language interpreter was used.  Arm Injury  The incident occurred more than 2 days ago. The incident occurred at home. The injury mechanism was a pulled limb. There is an injury to the right shoulder. The pain is moderate. It is unlikely that a foreign body is present. Pertinent negatives include no nausea, no vomiting and no weakness. There have been no prior injuries to these areas. She is right-handed. She has been behaving normally.   Traci Guzman is a 47 y.o. female who presents to the Emergency Department complaining of constant, moderate right arm pain onset 4 days ago. She reports that she tried to catch great aunt while she was falling and felt pop in right arm. Movement of right arm aggravates pain. Pt reports that she has taken tramadol with minor relief. She denies hx of similar symptom, numbness, weakness and any other pain.   Past Medical History  Diagnosis Date  . Restless leg syndrome   . Arthritis     primarily knees  . Fibromyalgia   . Hypertension   . Dyshidrotic foot dermatitis   . Healthcare maintenance     pap 2010, mammogram 2004  . Dyshidrotic foot dermatitis   . Depression 06/24/2011  . 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 History of anemia 08/31/2011  . HTN (hypertension) 06/23/2011  . 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 Cesarean section     2x-1985 and 1988  . Cholecystectomy     1995  . Tubal ligation     1988    Family History  Problem Relation Age of Onset  . Diabetes Mother     age 10    History  Substance Use Topics  . Smoking status: Former Smoker -- 1.0 packs/day for 26 years    Types: Cigarettes    Quit date: 06/06/2011  . Smokeless tobacco: Not on file  . Alcohol Use: 0.6 oz/week    1 Glasses of wine per week    OB History    Grav Para Term Preterm Abortions TAB SAB Ect Mult Living                  Review of Systems  Constitutional: Negative for fever and chills.  Respiratory: Negative for shortness of breath.   Gastrointestinal: Negative for nausea and vomiting.  Musculoskeletal:       R humerous pain  Neurological: Negative for weakness.  All other systems reviewed and are negative.    Allergies  Dairy aid and Milk-related compounds  Home Medications   Current Outpatient Rx  Name  Route  Sig  Dispense  Refill  . AMITRIPTYLINE HCL 100 MG PO TABS   Oral   Take 100 mg by mouth at bedtime. Takes with a 25 mg tab to total 125 mg daily         . AMITRIPTYLINE HCL 25 MG PO TABS   Oral   Take 25  mg by mouth at bedtime. Takes with 100 mg tab to total 125 mg daily         . CARBIDOPA-LEVODOPA 25-100 MG PO TABS   Oral   Take 1 tablet by mouth daily as needed. For restless leg syndrome         . CETIRIZINE HCL 10 MG PO TABS   Oral   Take 10 mg by mouth daily.         Marland Kitchen GABAPENTIN 300 MG PO CAPS   Oral   Take 1 capsule (300 mg total) by mouth at bedtime.   30 capsule   5   . LISINOPRIL-HYDROCHLOROTHIAZIDE 20-12.5 MG PO TABS   Oral   Take 1 tablet by mouth daily.   30 tablet   11   . TRAMADOL HCL 50 MG PO TABS   Oral   Take 50 mg by mouth every 8 (eight) hours as needed. For pain           BP 127/83  Pulse 101  Temp 98.5 F (36.9 C) (Oral)  Resp 14  SpO2 99%  LMP 10/18/2012  Physical Exam  Nursing note and vitals reviewed. Constitutional: She is oriented to person, place, and time. She appears  well-developed and well-nourished. No distress.  HENT:  Head: Normocephalic and atraumatic.  Eyes: EOM are normal.  Neck: Neck supple. No tracheal deviation present.  Cardiovascular: Normal rate.   Pulmonary/Chest: Effort normal. No respiratory distress.  Musculoskeletal: Normal range of motion. She exhibits tenderness (right anterior shoulder). She exhibits no edema.       Tenderness to midshaft humerous on the R after injury.  No swelling noted.  +cms below injury. No long trips.   Neurological: She is alert and oriented to person, place, and time. She has normal strength. No sensory deficit.  Skin: Skin is warm and dry.  Psychiatric: She has a normal mood and affect. Her behavior is normal.    ED Course  Procedures (including critical care time) DIAGNOSTIC STUDIES: Oxygen Saturation is 99% on room air, normal by my interpretation.    COORDINATION OF CARE: 9:55 PM Discussed ED treatment with pt     Labs Reviewed - No data to display Dg Humerus Right  10/27/2012  *RADIOLOGY REPORT*  Clinical Data: Right shoulder injury, pain.  RIGHT HUMERUS - 2+ VIEW  Comparison: None.  Findings: No acute bony abnormality.  Specifically, no fracture, subluxation, or dislocation.  Soft tissues are intact.  Mild degenerative changes in the right AC joint.  IMPRESSION: No acute bony abnormality.   Original Report Authenticated By: Charlett Nose, M.D.      No diagnosis found.    MDM  47 yo female with RUE pain after breaking a fall 4 days ago when her aunt was falling. Ibuprofen and percocet for pain in the ER.  Doubt blood clot.  No swelling.  She will follow up with ortho/pcp next week.        I personally performed the services described in this documentation, which was scribed in my presence. The recorded information has been reviewed and is accurate.   Remi Haggard, NP 10/28/12 1246

## 2012-10-27 NOTE — ED Notes (Signed)
Patient is alert and orientedx4.  Patient was explained discharge instructions and she understood them with no questions.  Patient's daughter, Carnella Guadalajara is driving the patient home.

## 2012-10-27 NOTE — ED Notes (Signed)
PT. REPORTS RIGHT UPPER ARM MUSCLE ACHE / SORENESS FOR SEVERAL DAYS AFTER SHE INJURED IT WHILE TRYING TO ASSIST HER ELDERLY AUNT  .

## 2012-10-28 ENCOUNTER — Other Ambulatory Visit: Payer: Self-pay | Admitting: Family Medicine

## 2012-10-29 ENCOUNTER — Encounter: Payer: Self-pay | Admitting: Family Medicine

## 2012-10-29 ENCOUNTER — Telehealth: Payer: Self-pay | Admitting: Family Medicine

## 2012-10-29 NOTE — Telephone Encounter (Signed)
Social call-called to see how patient was feeling after injury to arm. Patient states improved.   Encouraged patient to follow up with me within the month as we had planned 6 month follow up.

## 2012-11-02 NOTE — ED Provider Notes (Signed)
Medical screening examination/treatment/procedure(s) were performed by non-physician practitioner and as supervising physician I was immediately available for consultation/collaboration.   Hurman Horn, MD 11/02/12 2120

## 2012-11-10 ENCOUNTER — Emergency Department (HOSPITAL_COMMUNITY)
Admission: EM | Admit: 2012-11-10 | Discharge: 2012-11-10 | Disposition: A | Discharge status: Home / Self Care | Payer: Self-pay | Attending: Emergency Medicine | Admitting: Emergency Medicine

## 2012-11-10 ENCOUNTER — Emergency Department (HOSPITAL_COMMUNITY): Payer: Self-pay

## 2012-11-10 ENCOUNTER — Encounter (HOSPITAL_COMMUNITY): Payer: Self-pay | Admitting: Emergency Medicine

## 2012-11-10 DIAGNOSIS — Y9389 Activity, other specified: Secondary | ICD-10-CM | POA: Insufficient documentation

## 2012-11-10 DIAGNOSIS — I10 Essential (primary) hypertension: Secondary | ICD-10-CM | POA: Insufficient documentation

## 2012-11-10 DIAGNOSIS — X500XXA Overexertion from strenuous movement or load, initial encounter: Secondary | ICD-10-CM | POA: Insufficient documentation

## 2012-11-10 DIAGNOSIS — S4980XA Other specified injuries of shoulder and upper arm, unspecified arm, initial encounter: Secondary | ICD-10-CM | POA: Insufficient documentation

## 2012-11-10 DIAGNOSIS — Z79899 Other long term (current) drug therapy: Secondary | ICD-10-CM | POA: Insufficient documentation

## 2012-11-10 DIAGNOSIS — F329 Major depressive disorder, single episode, unspecified: Secondary | ICD-10-CM | POA: Insufficient documentation

## 2012-11-10 DIAGNOSIS — Y929 Unspecified place or not applicable: Secondary | ICD-10-CM | POA: Insufficient documentation

## 2012-11-10 DIAGNOSIS — Z862 Personal history of diseases of the blood and blood-forming organs and certain disorders involving the immune mechanism: Secondary | ICD-10-CM | POA: Insufficient documentation

## 2012-11-10 DIAGNOSIS — Z8669 Personal history of other diseases of the nervous system and sense organs: Secondary | ICD-10-CM | POA: Insufficient documentation

## 2012-11-10 DIAGNOSIS — Z8739 Personal history of other diseases of the musculoskeletal system and connective tissue: Secondary | ICD-10-CM | POA: Insufficient documentation

## 2012-11-10 DIAGNOSIS — F3289 Other specified depressive episodes: Secondary | ICD-10-CM | POA: Insufficient documentation

## 2012-11-10 DIAGNOSIS — S46909A Unspecified injury of unspecified muscle, fascia and tendon at shoulder and upper arm level, unspecified arm, initial encounter: Secondary | ICD-10-CM | POA: Insufficient documentation

## 2012-11-10 DIAGNOSIS — M25511 Pain in right shoulder: Secondary | ICD-10-CM

## 2012-11-10 DIAGNOSIS — Z87891 Personal history of nicotine dependence: Secondary | ICD-10-CM | POA: Insufficient documentation

## 2012-11-10 DIAGNOSIS — Z872 Personal history of diseases of the skin and subcutaneous tissue: Secondary | ICD-10-CM | POA: Insufficient documentation

## 2012-11-10 MED ORDER — HYDROCODONE-ACETAMINOPHEN 5-325 MG PO TABS: 2.0000 | ORAL_TABLET | Freq: Four times a day (QID) | ORAL | Status: DC | PRN

## 2012-11-10 NOTE — ED Notes (Signed)
Here for recheck of right shoulder injury. Pain continues.

## 2012-11-10 NOTE — ED Provider Notes (Signed)
History     CSN: [id number]  Arrival date & time 11/10/12  1113   First MD Initiated Contact with Patient 11/10/12 1124      Chief Complaint  Patient presents with  . Arm Injury    (Consider location/radiation/quality/duration/timing/severity/associated sxs/prior treatment) HPI Comments: Patient presents today with a chief complaint of pain of the right shoulder.  She reports that two weeks ago she tried to catch her Aunt to prevent her from falling over.  She states that she felt a "pop" in her shoulder at that time.  She has been having pain since that time.  She was evaluated in the ED two weeks ago and had an xray of the humerus done at that time, which was negative.  She has been wearing a sling intermittently since that time.  She has not followed up with Orthopedics.  She has been taking Tylenol for pain, but does not feel that it is helping.   She denies numbness or tingling.  Denies erythema, warmth, or swelling.  The history is provided by the patient.    Past Medical History  Diagnosis Date  . Restless leg syndrome   . Arthritis     primarily knees  . Fibromyalgia   . Hypertension   . Dyshidrotic foot dermatitis   . Healthcare maintenance     pap 2010, mammogram 2004  . Dyshidrotic foot dermatitis   . Depression 06/24/2011  . 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 History of anemia 08/31/2011  . HTN (hypertension) 06/23/2011  . 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 Cesarean section     2x-1985 and 1988  . Cholecystectomy     1995  . Tubal ligation     1988    Family History  Problem Relation Age of Onset  . Diabetes Mother     age 33    History  Substance Use Topics  . Smoking status: Former Smoker -- 1.0 packs/day for 26 years    Types: Cigarettes    Quit date: 06/06/2011  . Smokeless tobacco: Not on file  . Alcohol Use: 0.6 oz/week    1 Glasses of wine per week    OB History    Grav Para Term Preterm Abortions TAB SAB Ect Mult Living                  Review of Systems  Musculoskeletal:       Right shoulder pain  Skin: Negative for color change.  Neurological: Negative for numbness.  All other systems reviewed and are negative.    Allergies  Dairy aid; Gluten meal; and Milk-related compounds  Home Medications   Current Outpatient Rx  Name  Route  Sig  Dispense  Refill  . ONE-DAILY MULTI VITAMINS PO TABS   Oral   Take 1 tablet by mouth daily.         . TRAMADOL HCL 50 MG PO TABS   Oral   Take 50 mg by mouth every 8 (eight) hours as needed. For pain         . AMITRIPTYLINE HCL 100 MG PO TABS   Oral   Take 100 mg by mouth at bedtime. Takes with a 25 mg tab to total 125 mg daily         . AMITRIPTYLINE HCL 25 MG PO TABS   Oral   Take 25 mg by mouth at bedtime. Takes with 100 mg  tab to total 125 mg daily         . CETIRIZINE HCL 10 MG PO TABS   Oral   Take 10 mg by mouth daily.         . CYCLOBENZAPRINE HCL 10 MG PO TABS   Oral   Take 10 mg by mouth 2 (two) times daily as needed. For muscle spasms.         Marland Kitchen GABAPENTIN 300 MG PO CAPS   Oral   Take 1 capsule (300 mg total) by mouth at bedtime.   30 capsule   5   . LISINOPRIL-HYDROCHLOROTHIAZIDE 20-12.5 MG PO TABS   Oral   Take 1 tablet by mouth daily.   30 tablet   11     BP 100/65  Pulse 105  Temp 97.8 F (36.6 C) (Oral)  Resp 17  SpO2 98%  LMP 10/18/2012  Physical Exam  Nursing note and vitals reviewed. Constitutional: She appears well-developed and well-nourished. No distress.  HENT:  Head: Normocephalic and atraumatic.  Mouth/Throat: Oropharynx is clear and moist.  Neck: Normal range of motion. Neck supple.  Cardiovascular: Normal rate, regular rhythm and normal heart sounds.   Pulses:      Radial pulses are 2+ on the right side, and 2+ on the left side.  Pulmonary/Chest: Effort normal and breath sounds normal.  Musculoskeletal: She exhibits no edema.        Right shoulder: She exhibits tenderness, bony tenderness and pain. She exhibits normal range of motion, no swelling, no effusion, no deformity, normal pulse and normal strength.       Right elbow: She exhibits normal range of motion, no swelling, no effusion and no deformity. no tenderness found.       Pain with ROM of the left shoulder Tenderness to palpation over the [facility] joint and also the glenohumeral joint  Neurological: She is alert. No sensory deficit. Gait normal.  Skin: Skin is warm and dry. She is not diaphoretic.  Psychiatric: She has a normal mood and affect.    ED Course  Procedures (including critical care time)  Labs Reviewed - No data to display Dg Shoulder Right  11/10/2012  *RADIOLOGY REPORT*  Clinical Data: Right shoulder pain.  RIGHT SHOULDER - 2+ VIEW  Comparison: None.  Findings: No acute fracture or dislocation is identified.  Moderate degenerative changes are present at the [facility] joint.  Mild degenerative changes are present at the glenohumeral joint.  No bony lesions or destruction identified.  IMPRESSION: Degenerative changes of the [facility] and glenohumeral joints.   Original Report Authenticated By: Irish Lack, M.D.      No diagnosis found.    MDM  Patient with negative xray.  Neurovascularly intact.  Patient instructed to follow up with Orthopedics if the pain continues.       Pascal Lux Stallings, PA-C 11/10/12 1755

## 2012-11-10 NOTE — ED Notes (Signed)
Patient transported to X-ray 

## 2012-11-10 NOTE — ED Notes (Signed)
Pt. Stated, injured rt. Arm 2 weeks ago , seen in ED last week and told to come back here if no better.

## 2012-11-11 NOTE — ED Provider Notes (Signed)
Medical screening examination/treatment/procedure(s) were performed by non-physician practitioner and as supervising physician I was immediately available for consultation/collaboration.  Emillee Talsma, MD 11/11/12 0851 

## 2012-11-20 ENCOUNTER — Other Ambulatory Visit: Payer: Self-pay | Admitting: Family Medicine

## 2012-12-13 ENCOUNTER — Other Ambulatory Visit: Payer: Self-pay | Admitting: Family Medicine

## 2012-12-13 NOTE — Telephone Encounter (Signed)
Needs appointment before future tramadol refills.

## 2012-12-23 ENCOUNTER — Other Ambulatory Visit: Payer: Self-pay | Admitting: Family Medicine

## 2013-01-29 ENCOUNTER — Other Ambulatory Visit: Payer: Self-pay | Admitting: Family Medicine

## 2013-01-31 ENCOUNTER — Telehealth: Payer: Self-pay | Admitting: Family Medicine

## 2013-01-31 NOTE — Telephone Encounter (Signed)
Patient has an appt on 4/30 and is asking for at least enough Tramadol to last her until that appt.  She would like it sent to [facility] on [address].

## 2013-02-03 MED ORDER — TRAMADOL HCL 50 MG PO TABS: 50.0000 mg | ORAL_TABLET | Freq: Three times a day (TID) | ORAL | Status: DC | PRN

## 2013-02-13 ENCOUNTER — Encounter: Payer: Self-pay | Admitting: Family Medicine

## 2013-02-13 ENCOUNTER — Ambulatory Visit (INDEPENDENT_AMBULATORY_CARE_PROVIDER_SITE_OTHER): Payer: Self-pay | Admitting: Family Medicine

## 2013-02-13 VITALS — BP 124/79 | HR 105 | Temp 98.3°F | Ht 66.0 in | Wt 196.0 lb

## 2013-02-13 DIAGNOSIS — M797 Fibromyalgia: Secondary | ICD-10-CM

## 2013-02-13 DIAGNOSIS — F3289 Other specified depressive episodes: Secondary | ICD-10-CM

## 2013-02-13 DIAGNOSIS — J310 Chronic rhinitis: Secondary | ICD-10-CM

## 2013-02-13 DIAGNOSIS — G2581 Restless legs syndrome: Secondary | ICD-10-CM

## 2013-02-13 DIAGNOSIS — R002 Palpitations: Secondary | ICD-10-CM

## 2013-02-13 DIAGNOSIS — F329 Major depressive disorder, single episode, unspecified: Secondary | ICD-10-CM

## 2013-02-13 DIAGNOSIS — I10 Essential (primary) hypertension: Secondary | ICD-10-CM

## 2013-02-13 DIAGNOSIS — IMO0001 Reserved for inherently not codable concepts without codable children: Secondary | ICD-10-CM

## 2013-02-13 MED ORDER — TRAMADOL HCL 50 MG PO TABS: 50.0000 mg | ORAL_TABLET | Freq: Three times a day (TID) | ORAL | Status: DC | PRN

## 2013-02-13 MED ORDER — LISINOPRIL-HYDROCHLOROTHIAZIDE 20-12.5 MG PO TABS: 1.0000 | ORAL_TABLET | Freq: Every day | ORAL | Status: DC

## 2013-02-13 MED ORDER — AMITRIPTYLINE HCL 25 MG PO TABS: 25.0000 mg | ORAL_TABLET | Freq: Every day | ORAL | Status: DC

## 2013-02-13 MED ORDER — GABAPENTIN 300 MG PO CAPS: ORAL_CAPSULE | ORAL | Status: DC

## 2013-02-13 MED ORDER — AMITRIPTYLINE HCL 100 MG PO TABS: 100.0000 mg | ORAL_TABLET | Freq: Every day | ORAL | Status: DC

## 2013-02-13 MED ORDER — FLUTICASONE PROPIONATE 50 MCG/ACT NA SUSP: 2.0000 | Freq: Every day | NASAL | Status: DC

## 2013-02-13 MED ORDER — CETIRIZINE HCL 10 MG PO TABS: 10.0000 mg | ORAL_TABLET | Freq: Every day | ORAL | Status: DC

## 2013-02-13 NOTE — Assessment & Plan Note (Addendum)
Poorly controlled. Trial flonase. Antihistamines not as effective in chronic rhinitis but will continue zyrtec for chronic urticaria.

## 2013-02-13 NOTE — Assessment & Plan Note (Signed)
Improved. Will continue to follow. Likely due to PVCs. Defer beta blocker for now.

## 2013-02-13 NOTE — Progress Notes (Signed)
Subjective:   #Sinus Drainage-7 days a week, rhinorrhea, all year long x several years. No itchy watery eyes. Not seasonal. Some sinus pressure on right side. No fever/chills.   # Palpitations-decreased in frequency to every other week down from daily. 5-10 minutes. Doesn't stop her anymore. No chest pain or shortness of breath during spells.  TSH slightly low a year ago but did not have follow up testing.   # Fibromyalgia-pain is worse with extreme cold or heat. Still some overall fatigue. Pain in shoulders and lower back is main areas. Improves as she continues to lose weight. 10/10 at worst down to 5/10 with tramadol. Patient states tolerable on current regimen  #Depression-reports depressed mood has improved. Also, sleeping now 7.5 hours much improved. No anhedonia  #Hypertension and obesity- down 13 lbs. Overall down 29 lbs since first visit here. Patient attributes to gluten free diet.  BP Readings from Last 3 Encounters:  02/13/13 124/79  11/10/12 100/65  10/27/12 127/83  Home BP monitoring-130/80 at [employer]. No lightheadedness with standing.  Compliant with medications-yes without side effects Denies any CP, HA, SOB, blurry vision, LE edema, transient weakness, orthopnea, PND.   ROS--See HPI  Past Medical History-former smoker quit 2012. Current nonsmoker.  Reviewed problem list.  Medications- reviewed and updated Chief complaint-noted  Objective: BP 124/79  Pulse 105  Temp(Src) 98.3 F (36.8 C) (Oral)  Ht 5\' 6"  (1.676 m)  Wt 196 lb (88.905 kg)  BMI 31.65 kg/m2  LMP 02/05/2013 Gen: NAD, resting comfortably on table CV: RRR no murmurs rubs or gallops, HR  95 Lungs: CTAB no crackles, wheeze, rhonchi Skin: warm, dry, some dry scales on hands (continues to improve) Neuro: grossly normal, moves all extremities Ext: no edema    Assessment/Plan:  Labs discussed (will return once she has the orange card or 6 months max)-CBC, CMET, TSH, lipid, t3, t4, pap smear Social  history follow up-1st grandbaby expected in July. Just lost dog 4/29. Young nephews that she gets to enjoy regularly (baseball)

## 2013-02-13 NOTE — Assessment & Plan Note (Signed)
Well controlled. Continue current meds. Now off nsaids. Check labs next visit. Congratulated on weight loss.

## 2013-02-13 NOTE — Assessment & Plan Note (Signed)
Well controlled. Continue current amitriptyline.

## 2013-02-13 NOTE — Assessment & Plan Note (Signed)
Improved control of pain with weight loss. Will continue current dose of amitriptyline and refill tramadol. Not using mobic anymore. Congratulated weight loss but encouraged regular exercise as well.

## 2013-02-13 NOTE — Patient Instructions (Signed)
1. Sinus drainage-try flonase daily (helps better than zyrtec in chronic runny nose).  2. Palpitations-let me know if these ever increase or are bothersome as there is a medication that can help.  3. Fibromyalgia and Depression-I am glad things are doing better! Let's keep the same dose for now and you let me know if you feel you need more.  4. Blood pressure-well controlled, continue current meds.   See me once you get the orange card for pap smear and labs (ideally within 2 months),  Dr. Durene Cal

## 2013-04-18 ENCOUNTER — Encounter (HOSPITAL_COMMUNITY): Payer: Self-pay | Admitting: *Deleted

## 2013-04-18 ENCOUNTER — Emergency Department (HOSPITAL_COMMUNITY)
Admission: EM | Admit: 2013-04-18 | Discharge: 2013-04-19 | Disposition: A | Discharge status: Home / Self Care | Payer: Self-pay | Attending: Emergency Medicine | Admitting: Emergency Medicine

## 2013-04-18 DIAGNOSIS — Z862 Personal history of diseases of the blood and blood-forming organs and certain disorders involving the immune mechanism: Secondary | ICD-10-CM | POA: Insufficient documentation

## 2013-04-18 DIAGNOSIS — Z87891 Personal history of nicotine dependence: Secondary | ICD-10-CM | POA: Insufficient documentation

## 2013-04-18 DIAGNOSIS — F329 Major depressive disorder, single episode, unspecified: Secondary | ICD-10-CM | POA: Insufficient documentation

## 2013-04-18 DIAGNOSIS — M2550 Pain in unspecified joint: Secondary | ICD-10-CM | POA: Insufficient documentation

## 2013-04-18 DIAGNOSIS — Z8739 Personal history of other diseases of the musculoskeletal system and connective tissue: Secondary | ICD-10-CM | POA: Insufficient documentation

## 2013-04-18 DIAGNOSIS — R209 Unspecified disturbances of skin sensation: Secondary | ICD-10-CM | POA: Insufficient documentation

## 2013-04-18 DIAGNOSIS — I10 Essential (primary) hypertension: Secondary | ICD-10-CM | POA: Insufficient documentation

## 2013-04-18 DIAGNOSIS — Z79899 Other long term (current) drug therapy: Secondary | ICD-10-CM | POA: Insufficient documentation

## 2013-04-18 DIAGNOSIS — Z872 Personal history of diseases of the skin and subcutaneous tissue: Secondary | ICD-10-CM | POA: Insufficient documentation

## 2013-04-18 DIAGNOSIS — F3289 Other specified depressive episodes: Secondary | ICD-10-CM | POA: Insufficient documentation

## 2013-04-18 LAB — CBC WITH DIFFERENTIAL/PLATELET
Basophils Absolute: 0 10*3/uL (ref 0.0–0.1)
Eosinophils Relative: 4 % (ref 0–5)
HCT: 31.7 % — ABNORMAL LOW (ref 36.0–46.0)
Hemoglobin: 10.4 g/dL — ABNORMAL LOW (ref 12.0–15.0)
Lymphocytes Relative: 25 % (ref 12–46)
Lymphs Abs: 2.1 10*3/uL (ref 0.7–4.0)
MCV: 84.8 fL (ref 78.0–100.0)
Monocytes Absolute: 0.7 10*3/uL (ref 0.1–1.0)
Monocytes Relative: 9 % (ref 3–12)
RDW: 15.8 % — ABNORMAL HIGH (ref 11.5–15.5)
WBC: 8.2 10*3/uL (ref 4.0–10.5)

## 2013-04-18 LAB — BASIC METABOLIC PANEL
BUN: 17 mg/dL (ref 6–23)
CO2: 30 mEq/L (ref 19–32)
Calcium: 8.9 mg/dL (ref 8.4–10.5)
Creatinine, Ser: 0.93 mg/dL (ref 0.50–1.10)
Glucose, Bld: 100 mg/dL — ABNORMAL HIGH (ref 70–99)

## 2013-04-18 MED ORDER — MORPHINE SULFATE 4 MG/ML IJ SOLN
4.0000 mg | Freq: Once | INTRAMUSCULAR | Status: AC
  Administered 2013-04-18: 4 mg via INTRAVENOUS
  Filled 2013-04-18: qty 1

## 2013-04-18 MED ORDER — METHYLPREDNISOLONE SODIUM SUCC 125 MG IJ SOLR
125.0000 mg | Freq: Once | INTRAMUSCULAR | Status: AC
  Administered 2013-04-18: 125 mg via INTRAVENOUS
  Filled 2013-04-18: qty 2

## 2013-04-18 NOTE — ED Provider Notes (Signed)
History    CSN: [id number] Arrival date & time 04/18/13  1919  None    Chief Complaint  Patient presents with  . Leg Swelling  . Numbness   (Consider location/radiation/quality/duration/timing/severity/associated sxs/prior Treatment) HPI Comments: Pt w/ hx of eczema and arthritis now w/ polyarthralgia. States intermittent flair - monthly of similar sx. Yesterday developed pain in right foot ankle, today left elbow and right wrist. Denies injury, fever or emesis. Work up in past neg for SLE. Unsure if tested for RA. Notes swelling and pain radiating up right calf. No hx of DVT/PE or recent surgery or travel. Denies chest pain or dyspnea.   Patient is a 47 y.o. female presenting with general illness. The history is provided by the patient. No language interpreter was used.  Illness Location:  Musculoskeletal Quality:  Arthralgia  Severity:  Moderate Onset quality:  Sudden Timing:  Constant Progression:  Worsening Chronicity:  Recurrent Associated symptoms: no abdominal pain, no chest pain, no congestion, no cough, no diarrhea, no fever, no headaches, no nausea, no rash, no shortness of breath, no sore throat and no vomiting    Past Medical History  Diagnosis Date  . Restless leg syndrome   . Arthritis     primarily knees  . Fibromyalgia   . Hypertension   . Dyshidrotic foot dermatitis   . Healthcare maintenance     pap 2010, mammogram 2004  . Dyshidrotic foot dermatitis   . Depression 06/24/2011  . 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 History of anemia 08/31/2011  . HTN (hypertension) 06/23/2011  . 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 Cesarean section      2x-1985 and 1988  . Cholecystectomy      1995  . Tubal ligation      1988   Family History  Problem Relation Age of Onset  . Diabetes Mother     age 85   History  Substance Use Topics  . Smoking status: Former Smoker -- 1.00 packs/day  for 26 years    Types: Cigarettes    Quit date: 06/06/2011  . Smokeless tobacco: Not on file  . Alcohol Use: 0.6 oz/week    1 Glasses of wine per week   OB History   Grav Para Term Preterm Abortions TAB SAB Ect Mult Living                 Review of Systems  Constitutional: Negative for fever and chills.  HENT: Negative for congestion and sore throat.   Respiratory: Negative for cough and shortness of breath.   Cardiovascular: Positive for leg swelling. Negative for chest pain.  Gastrointestinal: Negative for nausea, vomiting, abdominal pain, diarrhea and constipation.  Genitourinary: Negative for dysuria and frequency.  Musculoskeletal: Positive for arthralgias.  Skin: Negative for color change and rash.  Neurological: Negative for dizziness and headaches.  Psychiatric/Behavioral: Negative for confusion and agitation.  All other systems reviewed and are negative.    Allergies  Dairy aid; Gluten meal; and Milk-related compounds  Home Medications   Current Outpatient Rx  Name  Route  Sig  Dispense  Refill  . amitriptyline (ELAVIL) 100 MG tablet   Oral   Take 1 tablet (100 mg total) by mouth at bedtime. Takes with a 25 mg tab to total 125 mg daily   30 tablet   11   . amitriptyline (ELAVIL) 25 MG tablet   Oral   Take 1  tablet (25 mg total) by mouth at bedtime. Takes with 100 mg tab to total 125 mg daily   30 tablet   11   . cetirizine (ZYRTEC) 10 MG tablet   Oral   Take 1 tablet (10 mg total) by mouth daily.   30 tablet   11   . fluticasone (FLONASE) 50 MCG/ACT nasal spray   Nasal   Place 2 sprays into the nose daily.   16 g   11   . gabapentin (NEURONTIN) 300 MG capsule      TAKE ONE CAPSULE BY MOUTH AT BEDTIME   30 capsule   11   . lisinopril-hydrochlorothiazide (PRINZIDE,ZESTORETIC) 20-12.5 MG per tablet   Oral   Take 1 tablet by mouth daily.   30 tablet   11   . Multiple Vitamin (MULTIVITAMIN) tablet   Oral   Take 1 tablet by mouth daily.          . traMADol (ULTRAM) 50 MG tablet   Oral   Take 1 tablet (50 mg total) by mouth every 8 (eight) hours as needed. For pain   60 tablet   5    BP 113/63  Pulse 110  Temp(Src) 98.4 F (36.9 C) (Oral)  Resp 14  SpO2 96%  LMP 04/18/2013 Physical Exam  Vitals reviewed. Constitutional: She is oriented to person, place, and time. She appears well-developed and well-nourished. No distress.  HENT:  Head: Normocephalic and atraumatic.  Eyes: EOM are normal. Pupils are equal, round, and reactive to light.  Neck: Normal range of motion. Neck supple.  Cardiovascular: Normal rate and regular rhythm.   Pulmonary/Chest: Effort normal. No respiratory distress.  Abdominal: Soft. She exhibits no distension.  Musculoskeletal: Normal range of motion. She exhibits no edema.       Left forearm: She exhibits no tenderness, no bony tenderness, no swelling and no edema.       Arms:      Feet:  Neurological: She is alert and oriented to person, place, and time.  Skin: Skin is warm and dry.  Psychiatric: She has a normal mood and affect. Her behavior is normal.    ED Course  Procedures (including critical care time) Labs Reviewed - No data to display No results found. No diagnosis found. Results for orders placed during the hospital encounter of 04/18/13  SEDIMENTATION RATE      Result Value Range   Sed Rate 51 (*) 0 - 22 mm/hr  URIC ACID      Result Value Range   Uric Acid, Serum 4.4  2.4 - 7.0 mg/dL  CBC WITH DIFFERENTIAL      Result Value Range   WBC 8.2  4.0 - 10.5 K/uL   RBC 3.74 (*) 3.87 - 5.11 MIL/uL   Hemoglobin 10.4 (*) 12.0 - 15.0 g/dL   HCT 16.1 (*) 09.6 - 04.5 %   MCV 84.8  78.0 - 100.0 fL   MCH 27.8  26.0 - 34.0 pg   MCHC 32.8  30.0 - 36.0 g/dL   RDW 40.9 (*) 81.1 - 91.4 %   Platelets 350  150 - 400 K/uL   Neutrophils Relative % 62  43 - 77 %   Neutro Abs 5.1  1.7 - 7.7 K/uL   Lymphocytes Relative 25  12 - 46 %   Lymphs Abs 2.1  0.7 - 4.0 K/uL   Monocytes Relative 9   3 - 12 %   Monocytes Absolute 0.7  0.1 - 1.0 K/uL  Eosinophils Relative 4  0 - 5 %   Eosinophils Absolute 0.3  0.0 - 0.7 K/uL   Basophils Relative 0  0 - 1 %   Basophils Absolute 0.0  0.0 - 0.1 K/uL  BASIC METABOLIC PANEL      Result Value Range   Sodium 136  135 - 145 mEq/L   Potassium 4.3  3.5 - 5.1 mEq/L   Chloride 100  96 - 112 mEq/L   CO2 30  19 - 32 mEq/L   Glucose, Bld 100 (*) 70 - 99 mg/dL   BUN 17  6 - 23 mg/dL   Creatinine, Ser 4.78  0.50 - 1.10 mg/dL   Calcium 8.9  8.4 - 29.5 mg/dL   GFR calc non Af Amer 72 (*) >90 mL/min   GFR calc Af Amer 84 (*) >90 mL/min    MDM  Exam as above, concern for psoriatic arthritis based on hx and exam. Doubt septic arthritis. ESR mild elevation at 51, CRP and RF pending no leukocytosis, uric acid normal. Given 125mg  solumedrol and morphine in ED w/ resolution of pain. Stable for d/c home. Given rx for prednisone burst. fup w/ pcp next wk for referral to rheumatology. Given return precautions. D/c in good condition.   I have personally reviewed labs and considered in my MDM. Case d/w DR Preston Fleeting  1. Joint pain    New Prescriptions   PREDNISONE (DELTASONE) 20 MG TABLET    3 tabs a day for 5 days   Shelva Majestic, MD [address] [place] [address] [phone]   consider referral to rheumatology     Audelia Hives, MD 04/19/13 [phone]

## 2013-04-18 NOTE — ED Notes (Signed)
Bilateral foot swelling, R > L; the swelling has spread up rt. Leg to knee; and has swelling in rt. wrist and lt. Elbow.  onset 1 day.  Pt. Has intermittent episodes of these symptoms which resolve.  New symptoms: pain above rt. Eyebrow.

## 2013-04-19 MED ORDER — PREDNISONE 20 MG PO TABS: ORAL_TABLET | ORAL | Status: DC

## 2013-04-19 NOTE — ED Provider Notes (Signed)
47 year old female comes in with two-day history of pain and swelling in her right wrist and right ankle. Several other joints are also painful including her left elbow. She's been having intermittent problems with pain and swelling in these joints for several years although symptoms usually only last a day or 2. This time, symptoms are getting worse. Also, of note, she has had eczema on her feet and hands for about the last 10 years. She has been tested in the past for rheumatoid arthritis and lupus and testing has always been negative. On exam, she has definite swelling of her right wrist and right ankle with synovial thickening. No other synovial thickening is identified. Overall picture seems most consistent with psoriatic arthritis. She does not have typical nail findings, but the temporal relation to her developing eczema and her arthritic symptoms is suggestive. Also, negative tests for rheumatoid factor and ANA are also consistent with psoriatic arthritis. She will be treated symptomatically with steroids and referred back to her family practice clinic for followup. I recommended referral to a rheumatologist for confirmation.  I saw and evaluated the patient, reviewed the resident's note and I agree with the findings and plan.   Dione Booze, MD 04/19/13 [phone]

## 2013-07-11 ENCOUNTER — Telehealth: Payer: Self-pay | Admitting: Family Medicine

## 2013-07-11 DIAGNOSIS — M797 Fibromyalgia: Secondary | ICD-10-CM

## 2013-07-11 MED ORDER — TRAMADOL HCL 50 MG PO TABS: 50.0000 mg | ORAL_TABLET | Freq: Three times a day (TID) | ORAL | Status: DC | PRN

## 2013-07-11 NOTE — Telephone Encounter (Signed)
Pt called and would like a refill of her tramadol called into the pharmacy. JW

## 2013-07-11 NOTE — Telephone Encounter (Signed)
Pt called appt made and Rx called in. Chonita Gadea, Maryjo Rochester

## 2013-07-11 NOTE — Telephone Encounter (Signed)
I am not sure of patient's insurance status (had been pursuing orange card) but I had asked her to come back in 2 months from late April.   I am going to place an rx for patient and have nurses call this in and call patient and ask her to be seen within the next month.   Blue team-please call and inform patient. Thanks. Also, please call pharmacy with refill request.

## 2013-07-25 ENCOUNTER — Ambulatory Visit: Payer: Self-pay | Admitting: Family Medicine

## 2013-08-12 ENCOUNTER — Ambulatory Visit: Payer: Self-pay | Admitting: Family Medicine

## 2013-09-03 ENCOUNTER — Ambulatory Visit (INDEPENDENT_AMBULATORY_CARE_PROVIDER_SITE_OTHER): Payer: Self-pay | Admitting: Family Medicine

## 2013-09-03 ENCOUNTER — Encounter: Payer: Self-pay | Admitting: Family Medicine

## 2013-09-03 VITALS — BP 128/74 | HR 118 | Temp 97.9°F | Ht 66.0 in | Wt 201.0 lb

## 2013-09-03 DIAGNOSIS — F329 Major depressive disorder, single episode, unspecified: Secondary | ICD-10-CM

## 2013-09-03 DIAGNOSIS — I10 Essential (primary) hypertension: Secondary | ICD-10-CM

## 2013-09-03 DIAGNOSIS — R002 Palpitations: Secondary | ICD-10-CM

## 2013-09-03 DIAGNOSIS — IMO0001 Reserved for inherently not codable concepts without codable children: Secondary | ICD-10-CM

## 2013-09-03 DIAGNOSIS — M797 Fibromyalgia: Secondary | ICD-10-CM

## 2013-09-03 MED ORDER — TRAMADOL HCL 50 MG PO TABS: 50.0000 mg | ORAL_TABLET | Freq: Three times a day (TID) | ORAL | Status: DC | PRN

## 2013-09-03 NOTE — Assessment & Plan Note (Signed)
Events even more irregular (1x a month now). Not bothersome enough to use beta blocker.

## 2013-09-03 NOTE — Patient Instructions (Signed)
1. Palpitations-I know these are bothersome on some days. If they become too bothersome, there are medications that can help.   3. Fibromyalgia and Depression-I am glad things are doing better! No changes in medications. Tramadol for 3 months.   3. Blood pressure-well controlled, continue current meds.   Call me when you get the orange card and I will put in labs for you to get, then see me a week after labs so we can discuss results and do your pap and well woman exam,  Dr. Durene Cal  See me within the year if possible.   Health Maintenance Due  Topic Date Due  . Pap Smear -next visit 08/31/2012  . Influenza Vaccine -cheaper at a [facility] than here 05/17/2013

## 2013-09-03 NOTE — Progress Notes (Signed)
  Tana Conch, MD Phone: [phone]  Subjective:  Chief complaint-noted  # Fibromyalgia-pain is worse with extreme cold or heat, so recent cold temperatures have been very difficult for patient especially since she has been out of pain medicine for about 2 months.  Pain in shoulders and lower back is main areas as previously noted. Has gained 5 lbs. Pain 10/10 at worst down to 5/10 with tramadol . ROS-mild fatigue noted  #Depression-reports depressed mood remained improved.  ROS-no SI/HI  # Palpitations-now about 1x a month lasting 5-10 minutes. History of slightly low TSH over a year ago.  ROS-No chest pain or shortness of breath during spells.   #Hypertension and obesity- weight up 5 lbs, down 24 from first visit here. Using gluten free diet. Initial BP noted with SBP >140 but improved on recheck.  BP Readings from Last 3 Encounters:  09/03/13 128/74  04/19/13 96/45  02/13/13 124/79  Home BP monitoring-130/80 at [facility]. Compliant with medications-yes without side effects Denies any CP, HA, SOB, blurry vision, LE edema, transient weakness, orthopnea, PND. No lightheadedness with standing.   Past Medical History-former smoker quit 2012. Current nonsmoker. DOes have exposure to some second hand smoke (advised to avoid as much as possible) Medications- reviewed and updated Current Outpatient Prescriptions  Medication Sig Dispense Refill  . amitriptyline (ELAVIL) 100 MG tablet Take 1 tablet (100 mg total) by mouth at bedtime. Takes with a 25 mg tab to total 125 mg daily  30 tablet  11  . amitriptyline (ELAVIL) 25 MG tablet Take 1 tablet (25 mg total) by mouth at bedtime. Takes with 100 mg tab to total 125 mg daily  30 tablet  11  . cetirizine (ZYRTEC) 10 MG tablet Take 10 mg by mouth at bedtime.      . gabapentin (NEURONTIN) 300 MG capsule Take 300 mg by mouth at bedtime.      Marland Kitchen lisinopril-hydrochlorothiazide (PRINZIDE,ZESTORETIC) 20-12.5 MG per tablet Take 1 tablet by mouth at  bedtime.      . Multiple Vitamin (MULTIVITAMIN) tablet Take 1 tablet by mouth at bedtime.       . traMADol (ULTRAM) 50 MG tablet Take 1 tablet (50 mg total) by mouth every 8 (eight) hours as needed. For pain. Refill monthly max.  90 tablet  2   No current facility-administered medications for this visit.   Objective: BP 128/74  Pulse 118  Temp(Src) 97.9 F (36.6 C) (Oral)  Ht 5\' 6"  (1.676 m)  Wt 201 lb (91.173 kg)  BMI 32.46 kg/m2 Gen: NAD, resting comfortably on table CV: RRR no murmurs rubs or gallops, HR  Low 100s on my exam Lungs: CTAB no crackles, wheeze, rhonchi Skin: warm, dry, some dry scales on hands (history of dyshidrotic eczema) Neuro: grossly normal, moves all extremities Ext: no edema  Assessment/Plan:  Labs discussed (will return once she has the orange card )-CBC, CMET, TSH, lipid, t3, t4. She will then follow up for pap smear and discussion of labs  Social history follow up-1st grandbaby born in [place] (her daughter) moving closer for 1 year. Raising a litter of dogs.  Young nephews that she gets to enjoy regularly (baseball) in the summer.

## 2013-09-03 NOTE — Assessment & Plan Note (Signed)
Well controlled. Continue current meds: lininopril-hctz 20-12.5 alone.

## 2013-09-03 NOTE — Assessment & Plan Note (Addendum)
Well controlled. Continue amitriptyline and prn tramadol. Gave #90 with 2 refills for monthly supply given increased pain during winter months.

## 2013-09-03 NOTE — Assessment & Plan Note (Signed)
Well controlled. Continue current meds: amitriptyline 125. Check phq9 at next visit.

## 2013-11-19 ENCOUNTER — Encounter: Payer: Self-pay | Admitting: Family Medicine

## 2013-11-19 ENCOUNTER — Ambulatory Visit (INDEPENDENT_AMBULATORY_CARE_PROVIDER_SITE_OTHER): Payer: Self-pay | Admitting: Family Medicine

## 2013-11-19 VITALS — BP 126/77 | HR 101 | Temp 98.7°F | Wt 199.0 lb

## 2013-11-19 DIAGNOSIS — F32A Depression, unspecified: Secondary | ICD-10-CM

## 2013-11-19 DIAGNOSIS — G2581 Restless legs syndrome: Secondary | ICD-10-CM

## 2013-11-19 DIAGNOSIS — IMO0001 Reserved for inherently not codable concepts without codable children: Secondary | ICD-10-CM

## 2013-11-19 DIAGNOSIS — M797 Fibromyalgia: Secondary | ICD-10-CM

## 2013-11-19 DIAGNOSIS — F329 Major depressive disorder, single episode, unspecified: Secondary | ICD-10-CM

## 2013-11-19 DIAGNOSIS — F3289 Other specified depressive episodes: Secondary | ICD-10-CM

## 2013-11-19 DIAGNOSIS — I10 Essential (primary) hypertension: Secondary | ICD-10-CM

## 2013-11-19 MED ORDER — AMITRIPTYLINE HCL 100 MG PO TABS: 100.0000 mg | ORAL_TABLET | Freq: Every day | ORAL | Status: DC

## 2013-11-19 MED ORDER — TRAMADOL HCL 50 MG PO TABS: 50.0000 mg | ORAL_TABLET | Freq: Three times a day (TID) | ORAL | Status: DC | PRN

## 2013-11-19 MED ORDER — LISINOPRIL-HYDROCHLOROTHIAZIDE 20-12.5 MG PO TABS: 1.0000 | ORAL_TABLET | Freq: Every day | ORAL | Status: DC

## 2013-11-19 MED ORDER — GABAPENTIN 300 MG PO CAPS: 600.0000 mg | ORAL_CAPSULE | Freq: Every day | ORAL | Status: DC

## 2013-11-19 MED ORDER — AMITRIPTYLINE HCL 25 MG PO TABS: 25.0000 mg | ORAL_TABLET | Freq: Every day | ORAL | Status: DC

## 2013-11-19 NOTE — Assessment & Plan Note (Signed)
Taking gabapentin only. Increased to 600mg  at nighttime as has had some worsening as of late.

## 2013-11-19 NOTE — Assessment & Plan Note (Signed)
Well controlled. Continue current meds: lisinopril-hctz 20-12.5. Needs labs when orange card available.

## 2013-11-19 NOTE — Assessment & Plan Note (Signed)
Well controlled. Not taking mobic regularly. Continue regular amitriptyline. Exercising as prescribed 30 minutes 4x a week-wt down 2 lbs. Also refilled tramadol x 3 months.

## 2013-11-19 NOTE — Assessment & Plan Note (Signed)
Stable with PHQ9 of 5. Continue current therapy. If 5 or less, plan to not make changes, if elevated above this could increase amitriptyline to 150mg .

## 2013-11-19 NOTE — Patient Instructions (Signed)
We refilled medicines today for 1 year. Glad you are doing so well. Only change is increase in gabapentin to 600mg  at night.   Call me when you get the orange card and I will put in labs for you to get, then see me a week after labs so we can discuss results and do your pap and well woman exam,  Dr. Durene CalHunter  Health Maintenance Due  Topic Date Due  . Pap Smear  08/31/2012

## 2013-11-19 NOTE — Progress Notes (Signed)
Tana Conch, MD Phone: [phone]  Subjective:  Chief complaint-noted  Hypertension BP Readings from Last 3 Encounters:  11/19/13 126/77  09/03/13 128/74  04/19/13 96/45  Compliant with medications-yes without side effects ROS-Denies any chest pain or shortness of breath. Does have palpitations but unchanged  Depression Taking 125 amitriptyline. Does well for most part except when she is alone. PHQ9 of 5 stable from 6 a year ago. Patient states she is happy with current state and does not want to make any changes. Just struggles due to life with son and husband currently in depressive state. Daughter with bipolar but in denial.  ROS-no SI/HI  Restless Legs Some more difficult nights over last 1-2 months. States husband or others state she kicks them a lot in sleep gabapentin 300mg  currently.  ROS-no numbness/tingling of legs in daytime  Fibromyalgia Pain continues to do well on current regimen. Able to get around the house with just amitriptyline. Pain aroudn 6-7/10. If gets above this takes tramadol. Tramadol allows her to exercise and pick up her grandchildren. Exercising 30 minutes walking at least 4x a week. Weight down 2 lbs over holidays.  ROS-no unintentional weight gain or loss  Past Medical History Patient Active Problem List   Diagnosis Date Noted  . Depression 06/24/2011    Priority: Medium  . Fibromyalgia 06/23/2011    Priority: Medium  . HTN (hypertension) 06/23/2011    Priority: Medium  . Chronic rhinitis 02/13/2013    Priority: Low  . Obesity (BMI 30.0-34.9) 02/21/2012    Priority: Low  . Heart palpitations 02/20/2012    Priority: Low  . History of anemia 08/31/2011    Priority: Low  . Urticarial rash, chronic since 6/12 08/10/2011    Priority: Low  . Dyshidrotic hand and foot dermatitis 06/24/2011    Priority: Low  . Arthritis 06/23/2011    Priority: Low  . Restless leg syndrome 06/23/2011    Priority: Low    Medications- reviewed and  updated Current Outpatient Prescriptions  Medication Sig Dispense Refill  . amitriptyline (ELAVIL) 100 MG tablet Take 1 tablet (100 mg total) by mouth at bedtime. Takes with a 25 mg tab to total 125 mg daily  30 tablet  11  . amitriptyline (ELAVIL) 25 MG tablet Take 1 tablet (25 mg total) by mouth at bedtime. Takes with 100 mg tab to total 125 mg daily  30 tablet  11  . cetirizine (ZYRTEC) 10 MG tablet Take 10 mg by mouth at bedtime.      . gabapentin (NEURONTIN) 300 MG capsule Take 1 capsules (300 mg total) by mouth at bedtime.  60 capsule  11  . lisinopril-hydrochlorothiazide (PRINZIDE,ZESTORETIC) 20-12.5 MG per tablet Take 1 tablet by mouth at bedtime.  30 tablet  11  . Multiple Vitamin (MULTIVITAMIN) tablet Take 1 tablet by mouth at bedtime.       . traMADol (ULTRAM) 50 MG tablet Take 1 tablet (50 mg total) by mouth every 8 (eight) hours as needed. For pain. Refill monthly max.  90 tablet  2   Objective: BP 126/77  Pulse 101  Temp(Src) 98.7 F (37.1 C) (Oral)  Wt 199 lb (90.266 kg)  LMP 09/24/2013 Gen: NAD, resting comfortably on table CV: RRR no murmurs rubs or gallops (not tachycardic on my exam) Lungs: CTAB no crackles, wheeze, rhonchi Ext: no edema  Assessment/Plan:  Labs discussed (will return once she has the orange card )-CBC, CMET, TSH, lipid,  t4. She will then follow up for pap smear and  discussion of labs  Social history follow up-1st grandbaby now 1036 months old. 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 Young nephews that she gets to enjoy regularly (baseball) in the summer.   Depression Stable with PHQ9 of 5. Continue current therapy. If 5 or less, plan to not make changes, if elevated above this could increase amitriptyline to 150mg .   Fibromyalgia Well controlled. Not taking mobic regularly. Continue regular amitriptyline. Exercising as prescribed 30 minutes 4x a week-wt down 2 lbs. Also refilled tramadol x 3 months.  HTN (hypertension) Well controlled. Continue  current meds: lisinopril-hctz 20-12.5. Needs labs when orange card available.      Restless leg syndrome Taking gabapentin only. Increased to 600mg  at nighttime as has had some worsening as of late.     No orders of the defined types were placed in this encounter.    Meds ordered this encounter  Medications  . amitriptyline (ELAVIL) 100 MG tablet    Sig: Take 1 tablet (100 mg total) by mouth at bedtime. Takes with a 25 mg tab to total 125 mg daily    Dispense:  30 tablet    Refill:  11  . amitriptyline (ELAVIL) 25 MG tablet    Sig: Take 1 tablet (25 mg total) by mouth at bedtime. Takes with 100 mg tab to total 125 mg daily    Dispense:  30 tablet    Refill:  11  . gabapentin (NEURONTIN) 300 MG capsule    Sig: Take 2 capsules (600 mg total) by mouth at bedtime.    Dispense:  60 capsule    Refill:  11  . lisinopril-hydrochlorothiazide (PRINZIDE,ZESTORETIC) 20-12.5 MG per tablet    Sig: Take 1 tablet by mouth at bedtime.    Dispense:  30 tablet    Refill:  11  . traMADol (ULTRAM) 50 MG tablet    Sig: Take 1 tablet (50 mg total) by mouth every 8 (eight) hours as needed. For pain. Refill monthly max.    Dispense:  90 tablet    Refill:  2

## 2014-03-05 ENCOUNTER — Other Ambulatory Visit: Payer: Self-pay | Admitting: *Deleted

## 2014-03-05 ENCOUNTER — Other Ambulatory Visit: Payer: Self-pay | Admitting: Family Medicine

## 2014-03-24 ENCOUNTER — Encounter (HOSPITAL_COMMUNITY): Payer: Self-pay | Admitting: Emergency Medicine

## 2014-03-24 ENCOUNTER — Emergency Department (HOSPITAL_COMMUNITY)
Admission: EM | Admit: 2014-03-24 | Discharge: 2014-03-24 | Disposition: A | Discharge status: Home / Self Care | Payer: Self-pay | Attending: Emergency Medicine | Admitting: Emergency Medicine

## 2014-03-24 ENCOUNTER — Emergency Department (HOSPITAL_COMMUNITY): Payer: Self-pay

## 2014-03-24 DIAGNOSIS — Z8739 Personal history of other diseases of the musculoskeletal system and connective tissue: Secondary | ICD-10-CM | POA: Insufficient documentation

## 2014-03-24 DIAGNOSIS — IMO0001 Reserved for inherently not codable concepts without codable children: Secondary | ICD-10-CM | POA: Insufficient documentation

## 2014-03-24 DIAGNOSIS — Z872 Personal history of diseases of the skin and subcutaneous tissue: Secondary | ICD-10-CM | POA: Insufficient documentation

## 2014-03-24 DIAGNOSIS — Z862 Personal history of diseases of the blood and blood-forming organs and certain disorders involving the immune mechanism: Secondary | ICD-10-CM | POA: Insufficient documentation

## 2014-03-24 DIAGNOSIS — I10 Essential (primary) hypertension: Secondary | ICD-10-CM | POA: Insufficient documentation

## 2014-03-24 DIAGNOSIS — F329 Major depressive disorder, single episode, unspecified: Secondary | ICD-10-CM | POA: Insufficient documentation

## 2014-03-24 DIAGNOSIS — F3289 Other specified depressive episodes: Secondary | ICD-10-CM | POA: Insufficient documentation

## 2014-03-24 DIAGNOSIS — R209 Unspecified disturbances of skin sensation: Secondary | ICD-10-CM | POA: Insufficient documentation

## 2014-03-24 DIAGNOSIS — R5381 Other malaise: Secondary | ICD-10-CM | POA: Insufficient documentation

## 2014-03-24 DIAGNOSIS — R531 Weakness: Secondary | ICD-10-CM

## 2014-03-24 DIAGNOSIS — Z79899 Other long term (current) drug therapy: Secondary | ICD-10-CM | POA: Insufficient documentation

## 2014-03-24 DIAGNOSIS — R5383 Other fatigue: Secondary | ICD-10-CM

## 2014-03-24 DIAGNOSIS — F17200 Nicotine dependence, unspecified, uncomplicated: Secondary | ICD-10-CM | POA: Insufficient documentation

## 2014-03-24 DIAGNOSIS — R200 Anesthesia of skin: Secondary | ICD-10-CM

## 2014-03-24 LAB — CBC WITH DIFFERENTIAL/PLATELET
Basophils Absolute: 0 10*3/uL (ref 0.0–0.1)
Basophils Relative: 0 % (ref 0–1)
EOS PCT: 5 % (ref 0–5)
Eosinophils Absolute: 0.6 10*3/uL (ref 0.0–0.7)
HCT: 34.9 % — ABNORMAL LOW (ref 36.0–46.0)
Hemoglobin: 11.6 g/dL — ABNORMAL LOW (ref 12.0–15.0)
Lymphocytes Relative: 21 % (ref 12–46)
Lymphs Abs: 2.3 10*3/uL (ref 0.7–4.0)
MCH: 29.7 pg (ref 26.0–34.0)
MCHC: 33.2 g/dL (ref 30.0–36.0)
MCV: 89.3 fL (ref 78.0–100.0)
Monocytes Absolute: 0.9 10*3/uL (ref 0.1–1.0)
Monocytes Relative: 9 % (ref 3–12)
Neutro Abs: 7.1 10*3/uL (ref 1.7–7.7)
Neutrophils Relative %: 65 % (ref 43–77)
PLATELETS: 379 10*3/uL (ref 150–400)
RBC: 3.91 MIL/uL (ref 3.87–5.11)
RDW: 17 % — ABNORMAL HIGH (ref 11.5–15.5)
WBC: 11 10*3/uL — ABNORMAL HIGH (ref 4.0–10.5)

## 2014-03-24 LAB — BASIC METABOLIC PANEL
BUN: 18 mg/dL (ref 6–23)
CALCIUM: 9.6 mg/dL (ref 8.4–10.5)
CO2: 29 meq/L (ref 19–32)
Chloride: 97 mEq/L (ref 96–112)
Creatinine, Ser: 1.07 mg/dL (ref 0.50–1.10)
GFR calc Af Amer: 70 mL/min — ABNORMAL LOW (ref >90)
GFR calc non Af Amer: 60 mL/min — ABNORMAL LOW (ref >90)
GLUCOSE: 94 mg/dL (ref 70–99)
POTASSIUM: 3.9 meq/L (ref 3.7–5.3)
SODIUM: 137 meq/L (ref 137–147)

## 2014-03-24 LAB — PREGNANCY, URINE: Preg Test, Ur: NEGATIVE

## 2014-03-24 MED ORDER — ACETAMINOPHEN 325 MG PO TABS
650.0000 mg | ORAL_TABLET | Freq: Once | ORAL | Status: AC
  Administered 2014-03-24: 650 mg via ORAL
  Filled 2014-03-24: qty 2

## 2014-03-24 NOTE — ED Provider Notes (Signed)
CSN: [id number]     Arrival date & time 03/24/14  1558 History   First MD Initiated Contact with Patient 03/24/14 2023     Chief Complaint  Patient presents with  . Numbness     (Consider location/radiation/quality/duration/timing/severity/associated sxs/prior Treatment) Patient is a 48 y.o. female presenting with general illness. The history is provided by the patient and a relative.  Illness Severity:  Severe Onset quality:  Sudden Timing:  Constant Progression:  Worsening Chronicity: similar to prior, but worse. Associated symptoms: no abdominal pain, no chest pain, no congestion, no cough, no diarrhea, no fever, no headaches, no nausea, no rash, no rhinorrhea, no shortness of breath and no vomiting     48 yo f pw numbness and weakness. Noticed this AM upon awakening. Has h/o intermittent spots to head and body that are numb and painful. Been told it's her fibromyalgia. This AM with same sensation but involving left side of face, shoulder, and arm initially. Over the course of the day the weakness, numbness, pain has moved to the right side of her face as well. No speech difficulty. Reports weakness to left shoulder. Mild weakness at elbow. Normal strength at wrist. States she walked into door frame last night. No loc. No n/v. No other recent head trauma. Ambulatory.  No headache except when touching spots on her head it hurts.   Past Medical History  Diagnosis Date  . Restless leg syndrome   . Arthritis     primarily knees  . Fibromyalgia   . Hypertension   . Dyshidrotic foot dermatitis   . Healthcare maintenance     pap 2010, mammogram 2004  . Dyshidrotic foot dermatitis   . Depression 06/24/2011  . 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 History of anemia 08/31/2011  . HTN (hypertension) 06/23/2011  . 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 Cesarean section      2x-1985 and 1988  . Cholecystectomy     1995  . Tubal ligation      1988   Family History  Problem Relation Age of Onset  . Diabetes Mother     age 6   History  Substance Use Topics  . Smoking status: Current Every Day Smoker -- 1.00 packs/day for 26 years    Types: Cigarettes    Last Attempt to Quit: 06/06/2011  . Smokeless tobacco: Not on file  . Alcohol Use: 0.6 oz/week    1 Glasses of wine per week   OB History   Grav Para Term Preterm Abortions TAB SAB Ect Mult Living                 Review of Systems  Constitutional: Negative for fever and chills.  HENT: Negative for congestion and rhinorrhea.   Eyes: Negative for pain and visual disturbance.  Respiratory: Negative for cough and shortness of breath.   Cardiovascular: Negative for chest pain and leg swelling.  Gastrointestinal: Negative for nausea, vomiting, abdominal pain and diarrhea.  Genitourinary: Negative for dysuria, hematuria, flank pain and difficulty urinating.  Musculoskeletal: Negative for back pain.  Skin: Negative for color change and rash.  Neurological: Positive for weakness and numbness. Negative for dizziness, light-headedness and headaches.  All other systems reviewed and are negative.     Allergies  Dairy aid; Gluten meal; and Milk-related compounds  Home Medications   Prior to Admission medications   Medication Sig Start Date End Date Taking? Authorizing Provider  amitriptyline (ELAVIL) 100 MG tablet Take 1 tablet (100 mg total) by mouth at bedtime. Takes with a 25 mg tab to total 125 mg daily 11/19/13  Yes Shelva Majestic, MD  amitriptyline (ELAVIL) 25 MG tablet Take 1 tablet (25 mg total) by mouth at bedtime. Takes with 100 mg tab to total 125 mg daily 11/19/13  Yes Shelva Majestic, MD  cetirizine (ZYRTEC) 10 MG tablet Take 10 mg by mouth at bedtime.   Yes Historical Provider, MD  gabapentin (NEURONTIN) 300 MG capsule Take 600 mg by mouth at bedtime.   Yes Historical Provider, MD  lisinopril-hydrochlorothiazide  (PRINZIDE,ZESTORETIC) 20-12.5 MG per tablet Take 1 tablet by mouth at bedtime. 11/19/13  Yes Shelva Majestic, MD  Multiple Vitamin (MULTIVITAMIN) tablet Take 1 tablet by mouth at bedtime.    Yes Historical Provider, MD  traMADol (ULTRAM) 50 MG tablet Take 1 tablet (50 mg total) by mouth every 8 (eight) hours as needed for moderate pain or severe pain. 03/05/14  Yes Shelva Majestic, MD   BP 113/74  Pulse 108  Temp(Src) 98.8 F (37.1 C)  Resp 18  Wt 198 lb 6 oz (89.982 kg)  SpO2 95%  LMP 03/02/2014 Physical Exam  Nursing note and vitals reviewed. Constitutional: She is oriented to person, place, and time. She appears well-developed and well-nourished. No distress.  HENT:  Head: Normocephalic and atraumatic.  Eyes: Conjunctivae and EOM are normal. Pupils are equal, round, and reactive to light. Right eye exhibits no discharge. Left eye exhibits no discharge.  Neck: Normal range of motion. Neck supple. Muscular tenderness (to light palpation of left lateral neck similar to over b/l face, left shoulder, left proximal arm) present. No spinous process tenderness present. No tracheal deviation present.  Cardiovascular: Normal rate, regular rhythm, normal heart sounds and intact distal pulses.   Pulmonary/Chest: Effort normal and breath sounds normal. No stridor. No respiratory distress. She has no wheezes. She has no rales.  Abdominal: Soft. She exhibits no distension. There is no tenderness. There is no guarding.  Musculoskeletal: She exhibits no edema and no tenderness.       Left shoulder: She exhibits normal range of motion (no decreased passive ROM), no tenderness, no bony tenderness, no swelling, no deformity and normal pulse.       Left elbow: She exhibits normal range of motion and no deformity. No tenderness found.       Left wrist: She exhibits normal range of motion, no tenderness and no deformity.  Reacts to light palpation of entire face, left neck, left shoulder, and left proximal arm.    Neurological: She is alert and oriented to person, place, and time. No cranial nerve deficit (except for reporting decreased sensation to both sides of face (equally), but reports ttp) or sensory deficit. Coordination and gait normal. GCS eye subscore is 4. GCS verbal subscore is 5. GCS motor subscore is 6.  Intact tandem gait.  Intermittent weakness to left shoulder. Distractable. On exam with 5/5 strength to shoulder adduction. Intermittent strength reduction to shoulder abduction. 5/5 strength to elbow and wrist. Occasionally lifts arm up spontaneously, but then hesitates and uses RUE.  Skin: Skin is warm and dry.  Psychiatric: She has a normal mood and affect. Her behavior is normal.    ED Course  Procedures (including critical care time) Labs Review Labs Reviewed  CBC WITH DIFFERENTIAL - Abnormal; Notable for the following:    WBC 11.0 (*)    Hemoglobin 11.6 (*)  HCT 34.9 (*)    RDW 17.0 (*)    All other components within normal limits  BASIC METABOLIC PANEL - Abnormal; Notable for the following:    GFR calc non Af Amer 60 (*)    GFR calc Af Amer 70 (*)    All other components within normal limits  PREGNANCY, URINE    Imaging Review Ct Cervical Spine Wo Contrast  03/24/2014   CLINICAL DATA:  Numbness on the left side of the forehead, cheek, jaw and shoulder.  EXAM: CT CERVICAL SPINE WITHOUT CONTRAST  TECHNIQUE: Multidetector CT imaging of the cervical spine was performed without intravenous contrast. Multiplanar CT image reconstructions were also generated.  COMPARISON:  None.  FINDINGS: Vertebral body height and alignment are maintained. Paraspinous soft tissue structures are unremarkable. Shallow disc bulges and endplate spurring are seen at C4-5, C5-6 and C6-7. No obvious central canal or foraminal stenosis is identified. Scattered facet degenerative disease is most notable on the left at C2-3.  IMPRESSION: No acute finding.  Mild appearing degenerative disc disease C4-5, C5-6  and C6-7 without obvious central canal or foraminal stenosis by CT scan.   Electronically Signed   By: Drusilla Kannerhomas  Dalessio M.D.   On: 03/24/2014 17:51     EKG Interpretation None      MDM   Final diagnoses:  Numbness  Weakness    48 yo F pw atypical numbness/weakness/pain. Both sides of face and LUE. Exam as above. Inconsistent. Ambulating in hallways. W/u unremarkable (CT c-spine ordered in triage). No posterior neck pain. No significant recent head trauma.  Do not suspect CVA, intracranial hemorrhage, spinal cord pathology or other emergent pathology.  Patient remains well appearing.  Has had similar, but less severe episodes of this pain and numbness credited to her fibromyalgia.  Discussed with patient and family member importance of close pcp follow up for further mgmt.   Labs and imaging reviewed by myself and considered in medical decision making if ordered. Imaging interpreted by radiology.   Discussed case with Dr. Rubin PayorPickering who is in agreement with assessment and plan.  Patient discharged home. Return precautions given. To follow up with pcp. patient in agreement with plan.     Stevie Kernyan Kiesha Ensey, MD 03/24/14 [phone]

## 2014-03-24 NOTE — Discharge Instructions (Signed)
Fatigue °Fatigue is a feeling of tiredness, lack of energy, lack of motivation, or feeling tired all the time. Having enough rest, good nutrition, and reducing stress will normally reduce fatigue. Consult your caregiver if it persists. The nature of your fatigue will help your caregiver to find out its cause. The treatment is based on the cause.  °CAUSES  °There are many causes for fatigue. Most of the time, fatigue can be traced to one or more of your habits or routines. Most causes fit into one or more of three general areas. They are: °Lifestyle problems °· Sleep disturbances. °· Overwork. °· Physical exertion. °· Unhealthy habits. °· Poor eating habits or eating disorders. °· Alcohol and/or drug use . °· Lack of proper nutrition (malnutrition). °Psychological problems °· Stress and/or anxiety problems. °· Depression. °· Grief. °· Boredom. °Medical Problems or Conditions °· Anemia. °· Pregnancy. °· Thyroid gland problems. °· Recovery from major surgery. °· Continuous pain. °· Emphysema or asthma that is not well controlled °· Allergic conditions. °· Diabetes. °· Infections (such as mononucleosis). °· Obesity. °· Sleep disorders, such as sleep apnea. °· Heart failure or other heart-related problems. °· Cancer. °· Kidney disease. °· Liver disease. °· Effects of certain medicines such as antihistamines, cough and cold remedies, prescription pain medicines, heart and blood pressure medicines, drugs used for treatment of cancer, and some antidepressants. °SYMPTOMS  °The symptoms of fatigue include:  °· Lack of energy. °· Lack of drive (motivation). °· Drowsiness. °· Feeling of indifference to the surroundings. °DIAGNOSIS  °The details of how you feel help guide your caregiver in finding out what is causing the fatigue. You will be asked about your present and past health condition. It is important to review all medicines that you take, including prescription and non-prescription items. A thorough exam will be done.  You will be questioned about your feelings, habits, and normal lifestyle. Your caregiver may suggest blood tests, urine tests, or other tests to look for common medical causes of fatigue.  °TREATMENT  °Fatigue is treated by correcting the underlying cause. For example, if you have continuous pain or depression, treating these causes will improve how you feel. Similarly, adjusting the dose of certain medicines will help in reducing fatigue.  °HOME CARE INSTRUCTIONS  °· Try to get the required amount of good sleep every night. °· Eat a healthy and nutritious diet, and drink enough water throughout the day. °· Practice ways of relaxing (including yoga or meditation). °· Exercise regularly. °· Make plans to change situations that cause stress. Act on those plans so that stresses decrease over time. Keep your work and personal routine reasonable. °· Avoid street drugs and minimize use of alcohol. °· Start taking a daily multivitamin after consulting your caregiver. °SEEK MEDICAL CARE IF:  °· You have persistent tiredness, which cannot be accounted for. °· You have fever. °· You have unintentional weight loss. °· You have headaches. °· You have disturbed sleep throughout the night. °· You are feeling sad. °· You have constipation. °· You have dry skin. °· You have gained weight. °· You are taking any new or different medicines that you suspect are causing fatigue. °· You are unable to sleep at night. °· You develop any unusual swelling of your legs or other parts of your body. °SEEK IMMEDIATE MEDICAL CARE IF:  °· You are feeling confused. °· Your vision is blurred. °· You feel faint or pass out. °· You develop severe headache. °· You develop severe abdominal, pelvic, or   back pain.  You develop chest pain, shortness of breath, or an irregular or fast heartbeat.  You are unable to pass a normal amount of urine.  You develop abnormal bleeding such as bleeding from the rectum or you vomit blood.  You have thoughts  about harming yourself or committing suicide.  You are worried that you might harm someone else. MAKE SURE YOU:   Understand these instructions.  Will watch your condition.  Will get help right away if you are not doing well or get worse. Document Released: 07/31/2007 Document Revised: 12/26/2011 Document Reviewed: 07/31/2007 [facility] Patient Information 2014 [place], [place].  Weakness Weakness is a lack of strength. It may be felt all over the body (generalized) or in one specific part of the body (focal). Some causes of weakness can be serious. You may need further medical evaluation, especially if you are elderly or you have a history of immunosuppression (such as chemotherapy or HIV), kidney disease, heart disease, or diabetes. CAUSES  Weakness can be caused by many different things, including:  Infection.  Physical exhaustion.  Internal bleeding or other blood loss that results in a lack of red blood cells (anemia).  Dehydration. This cause is more common in elderly people.  Side effects or electrolyte abnormalities from medicines, such as pain medicines or sedatives.  Emotional distress, anxiety, or depression.  Circulation problems, especially severe peripheral arterial disease.  Heart disease, such as rapid atrial fibrillation, bradycardia, or heart failure.  Nervous system disorders, such as Guillain-Barr syndrome, multiple sclerosis, or stroke. DIAGNOSIS  To find the cause of your weakness, your caregiver will take your history and perform a physical exam. Lab tests or X-rays may also be ordered, if needed. TREATMENT  Treatment of weakness depends on the cause of your symptoms and can vary greatly. HOME CARE INSTRUCTIONS   Rest as needed.  Eat a well-balanced diet.  Try to get some exercise every day.  Only take over-the-counter or prescription medicines as directed by your caregiver. SEEK MEDICAL CARE IF:   Your weakness seems to be getting worse or  spreads to other parts of your body.  You develop new aches or pains. SEEK IMMEDIATE MEDICAL CARE IF:   You cannot perform your normal daily activities, such as getting dressed and feeding yourself.  You cannot walk up and down stairs, or you feel exhausted when you do so.  You have shortness of breath or chest pain.  You have difficulty moving parts of your body.  You have weakness in only one area of the body or on only one side of the body.  You have a fever.  You have trouble speaking or swallowing.  You cannot control your bladder or bowel movements.  You have black or bloody vomit or stools. MAKE SURE YOU:  Understand these instructions.  Will watch your condition.  Will get help right away if you are not doing well or get worse. Document Released: 10/03/2005 Document Revised: 04/03/2012 Document Reviewed: 12/02/2011 [facility]ExitCare Patient Information 2014 [place], [place].  Neuropathic Pain We often think that pain has a physical cause. If we get rid of the cause, the pain should go away. Nerves themselves can also cause pain. It is called neuropathic pain, which means nerve abnormality. It may be difficult for the patients who have it and for the treating caregivers. Pain is usually described as acute (short-lived) or chronic (long-lasting). Acute pain is related to the physical sensations caused by an injury. It can last from a few seconds  to many weeks, but it usually goes away when normal healing occurs. Chronic pain lasts beyond the typical healing time. With neuropathic pain, the nerve fibers themselves may be damaged or injured. They then send incorrect signals to other pain centers. The pain you feel is real, but the cause is not easy to find.  CAUSES  Chronic pain can result from diseases, such as diabetes and shingles (an infection related to chickenpox), or from trauma, surgery, or amputation. It can also happen without any known injury or disease. The nerves are sending  pain messages, even though there is no identifiable cause for such messages.   Other common causes of neuropathy include diabetes, phantom limb pain, or Regional Pain Syndrome (RPS).  As with all forms of chronic back pain, if neuropathy is not correctly treated, there can be a number of associated problems that lead to a downward cycle for the patient. These include depression, sleeplessness, feelings of fear and anxiety, limited social interaction and inability to do normal daily activities or work.  The most dramatic and mysterious example of neuropathic pain is called "phantom limb syndrome." This occurs when an arm or a leg has been removed because of illness or injury. The brain still gets pain messages from the nerves that originally carried impulses from the missing limb. These nerves now seem to misfire and cause troubling pain.  Neuropathic pain often seems to have no cause. It responds poorly to standard pain treatment. Neuropathic pain can occur after:  Shingles (herpes zoster virus infection).  A lasting burning sensation of the skin, caused usually by injury to a peripheral nerve.  Peripheral neuropathy which is widespread nerve damage, often caused by diabetes or alcoholism.  Phantom limb pain following an amputation.  Facial nerve problems (trigeminal neuralgia).  Multiple sclerosis.  Reflex sympathetic dystrophy.  Pain which comes with cancer and cancer chemotherapy.  Entrapment neuropathy such as when pressure is put on a nerve such as in carpal tunnel syndrome.  Back, leg, and hip problems (sciatica).  Spine or back surgery.  HIV Infection or AIDS where nerves are infected by viruses. Your caregiver can explain items in the above list which may apply to you. SYMPTOMS  Characteristics of neuropathic pain are:  Severe, sharp, electric shock-like, shooting, lightening-like, knife-like.  Pins and needles sensation.  Deep burning, deep cold, or deep  ache.  Persistent numbness, tingling, or weakness.  Pain resulting from light touch or other stimulus that would not usually cause pain.  Increased sensitivity to something that would normally cause pain, such as a pinprick. Pain may persist for months or years following the healing of damaged tissues. When this happens, pain signals no longer sound an alarm about current injuries or injuries about to happen. Instead, the alarm system itself is not working correctly.  Neuropathic pain may get worse instead of better over time. For some people, it can lead to serious disability. It is important to be aware that severe injury in a limb can occur without a proper, protective pain response.Burns, cuts, and other injuries may go unnoticed. Without proper treatment, these injuries can become infected or lead to further disability. Take any injury seriously, and consult your caregiver for treatment. DIAGNOSIS  When you have a pain with no known cause, your caregiver will probably ask some specific questions:   Do you have any other conditions, such as diabetes, shingles, multiple sclerosis, or HIV infection?  How would you describe your pain? (Neuropathic pain is often described as shooting,  stabbing, burning, or searing.)  Is your pain worse at any time of the day? (Neuropathic pain is usually worse at night.)  Does the pain seem to follow a certain physical pathway?  Does the pain come from an area that has missing or injured nerves? (An example would be phantom limb pain.)  Is the pain triggered by minor things such as rubbing against the sheets at night? These questions often help define the type of pain involved. Once your caregiver knows what is happening, treatment can begin. Anticonvulsant, antidepressant drugs, and various pain relievers seem to work in some cases. If another condition, such as diabetes is involved, better management of that disorder may relieve the neuropathic pain.   TREATMENT  Neuropathic pain is frequently long-lasting and tends not to respond to treatment with narcotic type pain medication. It may respond well to other drugs such as antiseizure and antidepressant medications. Usually, neuropathic problems do not completely go away, but partial improvement is often possible with proper treatment. Your caregivers have large numbers of medications available to treat you. Do not be discouraged if you do not get immediate relief. Sometimes different medications or a combination of medications will be tried before you receive the results you are hoping for. See your caregiver if you have pain that seems to be coming from nowhere and does not go away. Help is available.  SEEK IMMEDIATE MEDICAL CARE IF:   There is a sudden change in the quality of your pain, especially if the change is on only one side of the body.  You notice changes of the skin, such as redness, black or purple discoloration, swelling, or an ulcer.  You cannot move the affected limbs. Document Released: 06/30/2004 Document Revised: 12/26/2011 Document Reviewed: 06/30/2004 [facility] Patient Information 2014 [place], [place].

## 2014-03-24 NOTE — ED Notes (Signed)
Pt reports fibromyalgia and has a hx of numb spots to her head. This morning when she woke up, feeling of numbness to left side of forehead, left cheek and jaw also left shoulder. But when these spots are touched they throb. Has weakness to left shoulder to elbow. Pt is a x 4.

## 2014-03-24 NOTE — ED Provider Notes (Signed)
I saw and evaluated the patient, reviewed the resident's note and I agree with the findings and plan.   EKG Interpretation None     Patient with multiple areas and numbness and some weakness. Has had left-sided face and right side of face. Has had similar symptoms in the past for her fibromyalgia. CT scan of her cervical spine reassuring. Doubt intracranial lesion. Will discharge home  Juliet Rude. Rubin Payor, MD 03/24/14 2352

## 2014-03-24 NOTE — ED Notes (Addendum)
Pt states that she has numbness and pain that runs down from her head to her left side of her neck to her left arm. Pt states that when she turns her head she has increase in pain down her left arm. Pt daughter also states that she has been complaining about warmth to the left side of her neck and redness. Pt no longer has either.

## 2014-03-31 ENCOUNTER — Ambulatory Visit (INDEPENDENT_AMBULATORY_CARE_PROVIDER_SITE_OTHER): Payer: Self-pay | Admitting: Family Medicine

## 2014-03-31 ENCOUNTER — Encounter: Payer: Self-pay | Admitting: Family Medicine

## 2014-03-31 VITALS — BP 120/71 | HR 105 | Temp 98.3°F | Wt 199.0 lb

## 2014-03-31 DIAGNOSIS — M259 Joint disorder, unspecified: Secondary | ICD-10-CM

## 2014-03-31 DIAGNOSIS — R29898 Other symptoms and signs involving the musculoskeletal system: Secondary | ICD-10-CM

## 2014-03-31 LAB — LIPID PANEL
CHOL/HDL RATIO: 4.7 ratio
Cholesterol: 203 mg/dL — ABNORMAL HIGH (ref 0–200)
HDL: 43 mg/dL (ref >39)
LDL Cholesterol: 133 mg/dL — ABNORMAL HIGH (ref 0–99)
Triglycerides: 133 mg/dL (ref <150)
VLDL: 27 mg/dL (ref 0–40)

## 2014-03-31 LAB — POCT GLYCOSYLATED HEMOGLOBIN (HGB A1C): HEMOGLOBIN A1C: 5.7

## 2014-03-31 MED ORDER — ASPIRIN EC 81 MG PO TBEC: 81.0000 mg | DELAYED_RELEASE_TABLET | Freq: Every day | ORAL | Status: DC

## 2014-03-31 MED ORDER — ASPIRIN 325 MG PO TABS
325.0000 mg | ORAL_TABLET | Freq: Once | ORAL | Status: AC
  Administered 2014-03-31: 325 mg via ORAL

## 2014-03-31 NOTE — Patient Instructions (Signed)
We are going to evaluate you for a stroke.   CT scan  Cholesterol  a1c (diabetes)  TSH  We discussed need to evaluate these things now and you opted to get labs but hold off on CT recognizing there is potential that there is a stroke.   Start asprin 81mg  (gave you one today)   If cholesterol elevated, we will start you on a cholesterol lowering medicine  See me at my next available after your CT to follow up, Dr. Durene CalHunter

## 2014-04-01 DIAGNOSIS — R29898 Other symptoms and signs involving the musculoskeletal system: Secondary | ICD-10-CM

## 2014-04-01 HISTORY — DX: Other symptoms and signs involving the musculoskeletal system: R29.898

## 2014-04-01 LAB — TSH: TSH: 0.382 u[IU]/mL (ref 0.350–4.500)

## 2014-04-01 LAB — T4, FREE: FREE T4: 0.81 ng/dL (ref 0.80–1.80)

## 2014-04-01 NOTE — Assessment & Plan Note (Signed)
Odd constellation of symptoms including left face, neck, arm numbness with weakness of left shoulder only, left jaw pain (like history of TMJ pain) with tinnitus, hearing loss, and no vertigo. She has had an unremarkable CT of cervical spine without indications of nerve impingement. I do not think this is likely CVA but think need to evaluate. Patient without insurance at thtis time but agrees to lab tests in office, will try to complete work for [employer] and then get CT scan next week. Discussed risk of larger stroke if this were in fact a stroke. RIsk stratification labs ordered. ASA given in office and advised 81mg  daily. Planned follow up after CT scan. GIven numbness feelings on scalp in past, MS is certainly in the differential though I think less likely. Left jaw pain without chest pain or shortness of breath-highly doubt cardiac etiology. Precepted case with Dr. Leveda AnnaHensel and Mauricio PoBreen who agree with management.

## 2014-04-01 NOTE — Progress Notes (Signed)
Tana ConchStephen Hunter, MD Phone: [phone]  Subjective:   Traci FrockShannon R Guzman is a 48 y.o. year old very pleasant female patient who presents with the following:  Left Shoulder Weakness with multiple other symptoms Patient presents today stating her left shoulder has been "paralyzed" since last Monday morning. She states she woke up with numb spots on her left face that went into her left neck, shoulder, and arm and later to the right side of her face. She noted that she could not use her left shoulder beyond 30 degrees abduction or flexion. 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 She states she has had "numb spots on my scalp from fibromyalgia" in the past. She went to the ED that evening and had a cervical spine CT which showed mild degenerative disc disease without central canal or foraminal stenosis. There was low suspicion for intracranial or spinal cord or emergent pathology.   Today, Patient returns for follow up and states that her symptoms have persisted and are essentially unchanged except the right side of her face no longer feels numb. SHe does admit to some tinnitus of the left ear but denies hearing loss or vertigo.    ROS- no fever/chills/diaphoresis/nausea/vomiting. No shortness of breast. No chest or shoulder or neck pain (all in jaw).   Past Medical History- 20 pack years but quit 5 years ago, depression, fibromyalgia (previously with rheumatoid workup about 10 years ago which was apparently negative and RF in ED last year which was negative, history of palpitations requiring event monitor which just showed PVCs, dyshidrotic dermatitis  Medications- reviewed and updated Current Outpatient Prescriptions  Medication Sig Dispense Refill  . amitriptyline (ELAVIL) 100 MG tablet Take 1 tablet (100 mg total) by mouth at bedtime. Takes with a 25 mg tab to total 125 mg daily  30 tablet  11  . amitriptyline (ELAVIL) 25 MG tablet Take 1 tablet (25 mg total) by mouth at  bedtime. Takes with 100 mg tab to total 125 mg daily  30 tablet  11  . aspirin EC 81 MG tablet Take 1 tablet (81 mg total) by mouth daily.  30 tablet  11  . cetirizine (ZYRTEC) 10 MG tablet Take 10 mg by mouth at bedtime.      . gabapentin (NEURONTIN) 300 MG capsule Take 600 mg by mouth at bedtime.      Marland Kitchen. lisinopril-hydrochlorothiazide (PRINZIDE,ZESTORETIC) 20-12.5 MG per tablet Take 1 tablet by mouth at bedtime.  30 tablet  11  . Multiple Vitamin (MULTIVITAMIN) tablet Take 1 tablet by mouth at bedtime.       . traMADol (ULTRAM) 50 MG tablet Take 1 tablet (50 mg total) by mouth every 8 (eight) hours as needed for moderate pain or severe pain.  90 tablet  2   No current facility-administered medications for this visit.    Objective: BP 120/71  Pulse 105  Temp(Src) 98.3 F (36.8 C) (Oral)  Wt 199 lb (90.266 kg)  LMP 03/02/2014 Gen: NAD, resting comfortably in chair Head: NCAT. PERRLA.  CV: RRR no mrg  Lungs: CTAB  Abd: soft/nontender/nondistended/normal bowel sounds  MSK: moves all extremities, no edema  Skin: warm and dry, no rash  Neuro: CN II-IV, VI-VII, IX-XII intact (with exception of 5 with reported lighter sensation on left side of face compared to right, also ), sensation to gross touch diminished on left neck, upper chest, shoulder and arm compared to right.  reflexes normal throughout including left arm, 5/5 muscle strength in  bilateral upper and lower extremities with exception of shoulder with 0/5 strength beyond 30 degrees flexion or abduction. Normal finger to nose on right but cannot participate on left. Normal rapid alternating movements of fingers.   Assessment/Plan:  Shoulder weakness Odd constellation of symptoms including left face, neck, arm numbness with weakness of left shoulder only, left jaw pain (like history of TMJ pain) with tinnitus, hearing loss, and no vertigo. She has had an unremarkable CT of cervical spine without indications of nerve impingement. I do  not think this is likely CVA but think need to evaluate. Patient without insurance at thtis time but agrees to lab tests in office, will try to complete work for [employer] and then get CT scan next week. Discussed risk of larger stroke if this were in fact a stroke. RIsk stratification labs ordered. ASA given in office and advised 81mg  daily. Planned follow up after CT scan. GIven numbness feelings on scalp in past, MS is certainly in the differential though I think less likely. Left jaw pain without chest pain or shortness of breath-highly doubt cardiac etiology. Precepted case with Dr. Leveda AnnaHensel and Mauricio PoBreen who agree with management.     Orders Placed This Encounter  Procedures  . CT Head Wo Contrast  . Lipid panel  . TSH  . T4, free  . POCT glycosylated hemoglobin (Hb A1C)    Meds ordered this encounter  Medications  . aspirin EC 81 MG tablet    Sig: Take 1 tablet (81 mg total) by mouth daily.    Dispense:  30 tablet    Refill:  11  . aspirin tablet 325 mg    Sig:

## 2014-04-02 ENCOUNTER — Telehealth: Payer: Self-pay | Admitting: *Deleted

## 2014-04-02 NOTE — Telephone Encounter (Signed)
Message copied by Henri MedalHARTSELL, JAZMIN M on Wed Apr 02, 2014  3:41 PM ------      Message from: Shelva MajesticHUNTER, STEPHEN O      Created: Tue Apr 01, 2014  9:17 PM       [facility], Please let patient know:      1. At risk for diabetes, but no current diabetes (some would call prediabetes)      2. Bad cholesterol elevated at 133 (goal below 100). If this ends up being stroke, would recommend statin. Otherwise, heart risk is low at 1.8% 10 year risk      3. Thyroid was ok.       4. Let her know when CT is scheduled ------

## 2014-04-02 NOTE — Telephone Encounter (Signed)
Patient is aware of results.  Also [facility] and I told her that I would call Friday to get her CT scan scheduled for the end of next week. Traci Guzman,CMA

## 2014-04-11 ENCOUNTER — Ambulatory Visit (HOSPITAL_COMMUNITY): Payer: Self-pay

## 2014-04-17 ENCOUNTER — Ambulatory Visit: Payer: Self-pay

## 2014-04-30 ENCOUNTER — Ambulatory Visit: Payer: No Typology Code available for payment source

## 2014-05-20 ENCOUNTER — Encounter: Payer: Self-pay | Admitting: Family Medicine

## 2014-05-20 ENCOUNTER — Ambulatory Visit (INDEPENDENT_AMBULATORY_CARE_PROVIDER_SITE_OTHER): Payer: No Typology Code available for payment source | Admitting: Family Medicine

## 2014-05-20 VITALS — BP 110/68 | HR 84 | Temp 98.6°F | Ht 66.0 in | Wt 199.0 lb

## 2014-05-20 DIAGNOSIS — R29810 Facial weakness: Secondary | ICD-10-CM

## 2014-05-20 DIAGNOSIS — R29898 Other symptoms and signs involving the musculoskeletal system: Secondary | ICD-10-CM

## 2014-05-20 DIAGNOSIS — M259 Joint disorder, unspecified: Secondary | ICD-10-CM

## 2014-05-20 HISTORY — DX: Facial weakness: R29.810

## 2014-05-20 MED ORDER — TRAMADOL HCL 50 MG PO TABS: 50.0000 mg | ORAL_TABLET | Freq: Three times a day (TID) | ORAL | Status: DC | PRN

## 2014-05-20 NOTE — Assessment & Plan Note (Signed)
See shoulder weakness A/P.

## 2014-05-20 NOTE — Patient Instructions (Signed)
Thank you for coming to the clinic today. We will order an MRI of your brain to further evaluate your weakness in your face and arm. We will contact you with the results. If it is a stroke, we will start a cholesterol medication and rehab.

## 2014-05-20 NOTE — Progress Notes (Signed)
Traci Guzman is a 48 y.o. female who [facility]. Her concerns today include:  HPI: Left side face and arm weakness/numbess. Symptoms started June 8th. Patient said she woke up and left arm was completely paralyzed. Also noticed dimmed vision in her left eye, decreased hearing in her left ear, and numbness on her left face. Was scheduled to have a CT scan to evaluate for possible stroke, however did not have orange card and did have scan done. Says that her symptoms her better today. She has regained some function of her left arm, however it still has some residual weakness. Also has some residual numbness in her left arm. Also reports that her left eye feels "heavy".  No fevers or chills. No chest pain or shortness of breath. No pain.   History Reviewed: former smoker.    Past Medical History  Diagnosis Date  . Restless leg syndrome   . Arthritis     primarily knees  . Fibromyalgia   . Hypertension   . Dyshidrotic foot dermatitis   . Healthcare maintenance     pap 2010, mammogram 2004  . Dyshidrotic foot dermatitis   . Depression 06/24/2011  . 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 History of anemia 08/31/2011  . HTN (hypertension) 06/23/2011  . Urticarial rash, chronic since 6/12 08/10/2011    ROS as above.    Medications reviewed. Current Outpatient Prescriptions  Medication Sig Dispense Refill  . amitriptyline (ELAVIL) 100 MG tablet Take 1 tablet (100 mg total) by mouth at bedtime. Takes with a 25 mg tab to total 125 mg daily  30 tablet  11  . amitriptyline (ELAVIL) 25 MG tablet Take 1 tablet (25 mg total) by mouth at bedtime. Takes with 100 mg tab to total 125 mg daily  30 tablet  11  . aspirin EC 81 MG tablet Take 1 tablet (81 mg total) by mouth daily.  30 tablet  11  . cetirizine (ZYRTEC) 10 MG tablet Take 10 mg by mouth at bedtime.      . gabapentin (NEURONTIN) 300 MG capsule Take 600 mg by mouth at bedtime.      Marland Kitchen.  lisinopril-hydrochlorothiazide (PRINZIDE,ZESTORETIC) 20-12.5 MG per tablet Take 1 tablet by mouth at bedtime.  30 tablet  11  . Multiple Vitamin (MULTIVITAMIN) tablet Take 1 tablet by mouth at bedtime.       . traMADol (ULTRAM) 50 MG tablet Take 1 tablet (50 mg total) by mouth every 8 (eight) hours as needed for moderate pain or severe pain.  90 tablet  2   No current facility-administered medications for this visit.    Exam:  BP 110/68  Pulse 84  Temp(Src) 98.6 F (37 C) (Oral)  Ht 5\' 6"  (1.676 m)  Wt 199 lb (90.266 kg)  BMI 32.13 kg/m2  LMP 05/19/2014 Gen: Well NAD HEENT: EOMI,  MMM Lungs: CTABL Nl WOB Heart: RRR no MRG Abd: NABS, NT, ND Exts: Non edematous BL  LE, warm and well perfused.  Neuro: No overt facial droop. Left CN7 weak compared to right. Some decreased sensation on left side of face. SCM 5/5 bilaterally. Tongue protrustion and uvula midline. Left shoulder 4/5 strength, right shoulder 5/5. Some decreased sensation in left arm. Strength in bilateral lower extremities 5/5. FNF intact bilaterally, though slow in left arm.  No results found for this or any previous visit (from the past 72 hour(s)).  A/P: See problem list  Facial weakness See shoulder  weakness A/P.  Shoulder weakness Improving per patient, though still with some residual weakness and numbness. Concern for CVA given clustering of symptoms to left arm and face. Will obtain an MR brain to rule out stroke. MS less likely given isolated occurrence of symptoms. If CVA shows stroke, will likely start high dose statin and refer to neuro rehab.    Katina Degree. Jimmey Ralph, MD [facility] Resident PGY-1 05/20/2014 11:34 AM

## 2014-05-20 NOTE — Assessment & Plan Note (Signed)
Improving per patient, though still with some residual weakness and numbness. Concern for CVA given clustering of symptoms to left arm and face. Will obtain an MR brain to rule out stroke. MS less likely given isolated occurrence of symptoms. If CVA shows stroke, will likely start high dose statin and refer to neuro rehab.

## 2014-05-23 ENCOUNTER — Ambulatory Visit (HOSPITAL_COMMUNITY)
Admission: RE | Admit: 2014-05-23 | Discharge: 2014-05-23 | Disposition: A | Discharge status: Home / Self Care | Payer: Self-pay | Source: Ambulatory Visit | Attending: Family Medicine | Admitting: Family Medicine

## 2014-05-23 DIAGNOSIS — R29810 Facial weakness: Secondary | ICD-10-CM | POA: Insufficient documentation

## 2014-05-26 ENCOUNTER — Telehealth: Payer: Self-pay | Admitting: Family Medicine

## 2014-05-26 NOTE — Telephone Encounter (Signed)
LM with Husband for pt to return call. Please inform of the below  Hi,   I'm covering for Traci Guzman while he's away. This patient received an MRI to rule out a stroke on Friday. The MRI was negative for stroke. It only showed some sinus changes/conjestion (which would not be a cause for her weakness she has been experiencing).   Could you contact her to let her know of this negative result? Also, to have her try to come back in to see Dr. Parker if her symptoms persist/worsen?   Thank you!  -Ian McKeag  (above message copied from staff message) [place] S[place]is Dawn   

## 2014-05-26 NOTE — Telephone Encounter (Signed)
Mrs. Meddings need to know results of MRI she had last Friday.  Please call at earliest convenience.

## 2014-06-05 ENCOUNTER — Encounter: Payer: Self-pay | Admitting: Family Medicine

## 2014-08-12 ENCOUNTER — Telehealth: Payer: Self-pay | Admitting: Family Medicine

## 2014-08-12 NOTE — Telephone Encounter (Signed)
Pt called and needs refill on her Tramadol left up front for pick up. Please call when ready to pick up. jw

## 2014-08-15 MED ORDER — TRAMADOL HCL 50 MG PO TABS: 50.0000 mg | ORAL_TABLET | Freq: Three times a day (TID) | ORAL | Status: DC | PRN

## 2014-08-15 NOTE — Telephone Encounter (Signed)
Prescription refilled and left at front.  Katina Degreealeb M. Jimmey RalphParker, MD [facility] Resident PGY-1 08/15/2014 12:32 PM

## 2014-08-15 NOTE — Telephone Encounter (Signed)
Pt informed. [name], Traci Guzman  

## 2014-12-08 ENCOUNTER — Other Ambulatory Visit: Payer: Self-pay

## 2014-12-08 DIAGNOSIS — M797 Fibromyalgia: Secondary | ICD-10-CM

## 2014-12-08 MED ORDER — AMITRIPTYLINE HCL 25 MG PO TABS: 25.0000 mg | ORAL_TABLET | Freq: Every day | ORAL | Status: DC

## 2014-12-08 MED ORDER — GABAPENTIN 300 MG PO CAPS: 600.0000 mg | ORAL_CAPSULE | Freq: Every day | ORAL | Status: DC

## 2014-12-12 ENCOUNTER — Other Ambulatory Visit: Payer: Self-pay | Admitting: Family Medicine

## 2014-12-12 DIAGNOSIS — M797 Fibromyalgia: Secondary | ICD-10-CM

## 2014-12-12 DIAGNOSIS — I159 Secondary hypertension, unspecified: Secondary | ICD-10-CM

## 2014-12-12 NOTE — Telephone Encounter (Signed)
Patient need refill on her tramadol and lisinopril.  Going out of town very early morning nex Friday.  Have appt scheduled on 3/17 @ 2:30.

## 2014-12-12 NOTE — Telephone Encounter (Signed)
Will forward to MD to see if he can give enough to last til her appt. Jazmin Hartsell,CMA

## 2014-12-15 MED ORDER — AMITRIPTYLINE HCL 100 MG PO TABS: 100.0000 mg | ORAL_TABLET | Freq: Every day | ORAL | Status: DC

## 2014-12-15 MED ORDER — AMITRIPTYLINE HCL 25 MG PO TABS: 25.0000 mg | ORAL_TABLET | Freq: Every day | ORAL | Status: DC

## 2014-12-15 MED ORDER — LISINOPRIL-HYDROCHLOROTHIAZIDE 20-12.5 MG PO TABS
1.0000 | ORAL_TABLET | Freq: Every day | ORAL | Status: DC
Start: 2014-12-15 — End: 2015-01-01

## 2014-12-15 MED ORDER — TRAMADOL HCL 50 MG PO TABS: 50.0000 mg | ORAL_TABLET | Freq: Three times a day (TID) | ORAL | Status: DC | PRN

## 2014-12-15 NOTE — Telephone Encounter (Signed)
Pt called and said that she also needs 100 mg of her Amitriptyline sent in plus Lisinopril and also wants to have her Tramadol. Please call patient when done. jw

## 2014-12-15 NOTE — Telephone Encounter (Signed)
Amitriptyline and lisinopril/hctz sent in. Rx for tramadol printed off and left at front of office.  Katina Degreealeb M. Jimmey RalphParker, MD [facility] Resident PGY-1 12/15/2014 3:15 PM

## 2014-12-15 NOTE — Telephone Encounter (Signed)
LM for patient that rx is ready for pick up. Effrey Davidow,CMA  

## 2014-12-15 NOTE — Telephone Encounter (Signed)
Will forward to PCP. Gabbriella Presswood CMA.

## 2015-01-01 ENCOUNTER — Ambulatory Visit (INDEPENDENT_AMBULATORY_CARE_PROVIDER_SITE_OTHER): Payer: Self-pay | Admitting: Family Medicine

## 2015-01-01 ENCOUNTER — Encounter: Payer: Self-pay | Admitting: Family Medicine

## 2015-01-01 VITALS — BP 106/70 | HR 111 | Temp 98.5°F | Ht 66.0 in | Wt 189.0 lb

## 2015-01-01 DIAGNOSIS — M797 Fibromyalgia: Secondary | ICD-10-CM

## 2015-01-01 DIAGNOSIS — I159 Secondary hypertension, unspecified: Secondary | ICD-10-CM

## 2015-01-01 DIAGNOSIS — Z0000 Encounter for general adult medical examination without abnormal findings: Secondary | ICD-10-CM | POA: Insufficient documentation

## 2015-01-01 MED ORDER — AMITRIPTYLINE HCL 25 MG PO TABS: 25.0000 mg | ORAL_TABLET | Freq: Every day | ORAL | Status: DC

## 2015-01-01 MED ORDER — GABAPENTIN 300 MG PO CAPS: 600.0000 mg | ORAL_CAPSULE | Freq: Every day | ORAL | Status: DC

## 2015-01-01 MED ORDER — LISINOPRIL-HYDROCHLOROTHIAZIDE 20-12.5 MG PO TABS: 1.0000 | ORAL_TABLET | Freq: Every day | ORAL | Status: DC

## 2015-01-01 NOTE — Patient Instructions (Signed)
Thank you for coming to the clinic today. It was nice seeing you.  Your blood pressure looked good today! We will continue the lisnopril-HCTZ for now.  We also refilled your doses of amitriptyline and gabapentin today.   You are due for some blood work. We can draw this at your next visit when you have insurance.  See you again in 3-6 months or sooner if you need anything.

## 2015-01-01 NOTE — Assessment & Plan Note (Signed)
Due for pap. Will discuss at next visit. Declined flu vaccine today.

## 2015-01-01 NOTE — Assessment & Plan Note (Signed)
Well controlled on lisinopril-HCTZ.  Due for lab work, but patient elected to defer until she has insurance.

## 2015-01-01 NOTE — Assessment & Plan Note (Signed)
Well controlled. Will continue amitriptyline, gabapentin, and tramadol.

## 2015-01-01 NOTE — Progress Notes (Signed)
Traci Guzman is a 49 y.o. female who presents to the [facility] today with a chief complaint of medication refill. Her concerns today include:  HPI:  Chronic Pain Longstanding history secondary to fibromyalgia. Currently on amitriptyline, gabapentin, and tramadol. Reports that pain is manageable with these medications. No new pain, weakness, or numbness. Has been tolerating medications without side effects.   Hypertension BP Readings from Last 3 Encounters:  01/01/15 106/70  05/20/14 110/68  03/31/14 120/71   Home BP monitoring-Yes - 120/80s Compliant with medications-yes, without side effects ROS-Denies any CP, HA, SOB, blurry vision, LE edema, transient weakness, orthopnea, PND.    ROS: As per HPI, otherwise all systems reviewed and are negative.  Past Medical History - Reviewed and updated Patient Active Problem List   Diagnosis Date Noted  . Healthcare maintenance 01/01/2015  . Facial weakness 05/20/2014  . Shoulder weakness 04/01/2014  . Chronic rhinitis 02/13/2013  . Obesity (BMI 30.0-34.9) 02/21/2012  . Heart palpitations 02/20/2012  . History of anemia 08/31/2011  . Urticarial rash, chronic since 6/12 08/10/2011  . 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 Depression 06/24/2011  . Arthritis 06/23/2011  . Fibromyalgia 06/23/2011  . Restless leg syndrome 06/23/2011  . HTN (hypertension) 06/23/2011    Medications- reviewed and updated Current Outpatient Prescriptions  Medication Sig Dispense Refill  . amitriptyline (ELAVIL) 100 MG tablet Take 1 tablet (100 mg total) by mouth at bedtime. Takes with a 25 mg tab to total 125 mg daily 30 tablet 11  . amitriptyline (ELAVIL) 25 MG tablet Take 1 tablet (25 mg total) by mouth at bedtime. Takes with 100 mg tab to total 125 mg daily 30 tablet 3  . aspirin EC 81 MG tablet Take 1 tablet (81 mg total) by mouth daily. 30 tablet 11  . cetirizine (ZYRTEC) 10 MG tablet Take 10 mg by mouth at bedtime.    . gabapentin  (NEURONTIN) 300 MG capsule Take 2 capsules (600 mg total) by mouth at bedtime. 60 capsule 5  . lisinopril-hydrochlorothiazide (PRINZIDE,ZESTORETIC) 20-12.5 MG per tablet Take 1 tablet by mouth at bedtime. 30 tablet 11  . Multiple Vitamin (MULTIVITAMIN) tablet Take 1 tablet by mouth at bedtime.     . traMADol (ULTRAM) 50 MG tablet Take 1 tablet (50 mg total) by mouth every 8 (eight) hours as needed for moderate pain or severe pain. 90 tablet 2   No current facility-administered medications for this visit.    Objective: Physical Exam: BP 106/70 mmHg  Pulse 111  Temp(Src) 98.5 F (36.9 C) (Oral)  Ht  (1.676 m)  Wt 189 lb (85.73 kg)  BMI 30.52 kg/m2  Gen: NAD, resting comfortably CV: RRR with no murmurs appreciated Lungs: NWOB, CTAB with no crackles, wheezes, or rhonchi Abdomen: Normal bowel sounds present. Soft, Nontender, Nondistended. Ext: no edema Skin: warm, dry Neuro: grossly normal, moves all extremities  A/P: See problem list  Fibromyalgia Well controlled. Will continue amitriptyline, gabapentin, and tramadol.    HTN (hypertension) Well controlled on lisinopril-HCTZ.  Due for lab work, but patient elected to defer until she has insurance.    Healthcare maintenance Due for pap. Will discuss at next visit. Declined flu vaccine today.      Meds ordered this encounter  Medications  . amitriptyline (ELAVIL) 25 MG tablet    Sig: Take 1 tablet (25 mg total) by mouth at bedtime. Takes with 100 mg tab to total 125 mg daily    Dispense:  30 tablet  Refill:  3  . gabapentin (NEURONTIN) 300 MG capsule    Sig: Take 2 capsules (600 mg total) by mouth at bedtime.    Dispense:  60 capsule    Refill:  5  . lisinopril-hydrochlorothiazide (PRINZIDE,ZESTORETIC) 20-12.5 MG per tablet    Sig: Take 1 tablet by mouth at bedtime.    Dispense:  30 tablet    Refill:  11   Jahmani Staup M. Jimmey RalphParker, MD [facility] Resident PGY-1 01/01/2015 5:52 PM

## 2015-02-25 ENCOUNTER — Other Ambulatory Visit: Payer: Self-pay | Admitting: Family Medicine

## 2015-02-25 NOTE — Telephone Encounter (Signed)
Will forward to MD.  Patient has an appt on 03/10/15. Jazmin Hartsell,CMA

## 2015-02-25 NOTE — Telephone Encounter (Signed)
Needs refill on tramadol °Please call when ready ° °

## 2015-02-26 MED ORDER — TRAMADOL HCL 50 MG PO TABS: 50.0000 mg | ORAL_TABLET | Freq: Three times a day (TID) | ORAL | Status: DC | PRN

## 2015-02-26 NOTE — Telephone Encounter (Signed)
Pt advised as directed below and verbalized understanding. Caddie Randle, CMA. 

## 2015-02-26 NOTE — Telephone Encounter (Signed)
Rx filled and left at front  [facility] Jimmey RalphParker, MD [facility] Resident PGY-1 02/26/2015 12:15 PM

## 2015-03-10 ENCOUNTER — Ambulatory Visit: Payer: Self-pay | Admitting: Family Medicine

## 2015-04-14 ENCOUNTER — Encounter (HOSPITAL_COMMUNITY): Payer: Self-pay | Admitting: Emergency Medicine

## 2015-04-14 ENCOUNTER — Emergency Department (HOSPITAL_COMMUNITY)
Admission: EM | Admit: 2015-04-14 | Discharge: 2015-04-14 | Disposition: A | Discharge status: Home / Self Care | Payer: Self-pay | Attending: Emergency Medicine | Admitting: Emergency Medicine

## 2015-04-14 DIAGNOSIS — M170 Bilateral primary osteoarthritis of knee: Secondary | ICD-10-CM | POA: Insufficient documentation

## 2015-04-14 DIAGNOSIS — K047 Periapical abscess without sinus: Secondary | ICD-10-CM | POA: Insufficient documentation

## 2015-04-14 DIAGNOSIS — M797 Fibromyalgia: Secondary | ICD-10-CM | POA: Insufficient documentation

## 2015-04-14 DIAGNOSIS — Z862 Personal history of diseases of the blood and blood-forming organs and certain disorders involving the immune mechanism: Secondary | ICD-10-CM | POA: Insufficient documentation

## 2015-04-14 DIAGNOSIS — F329 Major depressive disorder, single episode, unspecified: Secondary | ICD-10-CM | POA: Insufficient documentation

## 2015-04-14 DIAGNOSIS — Z79899 Other long term (current) drug therapy: Secondary | ICD-10-CM | POA: Insufficient documentation

## 2015-04-14 DIAGNOSIS — G2581 Restless legs syndrome: Secondary | ICD-10-CM | POA: Insufficient documentation

## 2015-04-14 DIAGNOSIS — K029 Dental caries, unspecified: Secondary | ICD-10-CM | POA: Insufficient documentation

## 2015-04-14 DIAGNOSIS — Z872 Personal history of diseases of the skin and subcutaneous tissue: Secondary | ICD-10-CM | POA: Insufficient documentation

## 2015-04-14 DIAGNOSIS — Z720 Tobacco use: Secondary | ICD-10-CM | POA: Insufficient documentation

## 2015-04-14 DIAGNOSIS — I10 Essential (primary) hypertension: Secondary | ICD-10-CM | POA: Insufficient documentation

## 2015-04-14 DIAGNOSIS — Z7982 Long term (current) use of aspirin: Secondary | ICD-10-CM | POA: Insufficient documentation

## 2015-04-14 MED ORDER — HYDROCODONE-ACETAMINOPHEN 5-325 MG PO TABS: 1.0000 | ORAL_TABLET | ORAL | Status: DC | PRN

## 2015-04-14 MED ORDER — IBUPROFEN 800 MG PO TABS: 800.0000 mg | ORAL_TABLET | Freq: Three times a day (TID) | ORAL | Status: DC | PRN

## 2015-04-14 MED ORDER — PENICILLIN V POTASSIUM 500 MG PO TABS: 500.0000 mg | ORAL_TABLET | Freq: Four times a day (QID) | ORAL | Status: AC

## 2015-04-14 NOTE — Discharge Instructions (Signed)
Read the information below.  Use the prescribed medication as directed.  Please discuss all new medications with your pharmacist.  Do not take additional tylenol while taking the prescribed pain medication to avoid overdose.  You may return to the Emergency Department at any time for worsening condition or any new symptoms that concern you.  Please call the dentist listed above within 48 hours to schedule a close follow up appointment.  If you develop fevers, swelling in your face, difficulty swallowing or breathing, return to the ER immediately for a recheck.   ° °Abscessed Tooth °An abscessed tooth is an infection around your tooth. It may be caused by holes or damage to the tooth (cavity) or a dental disease. An abscessed tooth causes mild to very bad pain in and around the tooth. See your dentist right away if you have tooth or gum pain. °HOME CARE °· Take your medicine as told. Finish it even if you start to feel better. °· Do not drive after taking pain medicine. °· Rinse your mouth (gargle) often with salt water (¼ teaspoon salt in 8 ounces of warm water). °· Do not apply heat to the outside of your face. °GET HELP RIGHT AWAY IF:  °· You have a temperature by mouth above 102° F (38.9° C), not controlled by medicine. °· You have chills and a very bad headache. °· You have problems breathing or swallowing. °· Your mouth will not open. °· You develop puffiness (swelling) on the neck or around the eye. °· Your pain is not helped by medicine. °· Your pain is getting worse instead of better. °MAKE SURE YOU:  °· Understand these instructions. °· Will watch your condition. °· Will get help right away if you are not doing well or get worse. °Document Released: 03/21/2008 Document Revised: 12/26/2011 Document Reviewed: 01/11/2011 °ExitCare® Patient Information ©2015 [employer]. This information is not intended to replace advice given to you by your health care provider. Make sure you discuss any questions you have  with your health care provider. ° ° ° °Emergency Department Resource Guide °1) Find a Doctor and Pay Out of Pocket °Although you won't have to find out who is covered by your insurance plan, it is a good idea to ask around and get recommendations. You will then need to call the office and see if the doctor you have chosen will accept you as a new patient and what types of options they offer for patients who are self-pay. Some doctors offer discounts or will set up payment plans for their patients who do not have insurance, but you will need to ask so you aren't surprised when you get to your appointment. ° °2) Contact Your Local Health Department °Not all health departments have doctors that can see patients for sick visits, but many do, so it is worth a call to see if yours does. If you don't know where your local health department is, you can check in your phone book. The CDC also has a tool to help you locate your state's health department, and many state websites also have listings of all of their local health departments. ° °3) Find a Walk-in Clinic °If your illness is not likely to be very severe or complicated, you may want to try a walk in clinic. These are popping up all over the country in pharmacies, drugstores, and shopping centers. They're usually staffed by nurse practitioners or physician assistants that have been trained to treat common illnesses and complaints. They're   usually fairly quick and inexpensive. However, if you have serious medical issues or chronic medical problems, these are probably not your best option. ° °No Primary Care Doctor: °- Call [facility] at  832-8000 - they can help you locate a primary care doctor that  accepts your insurance, provides certain services, etc. °- Physician Referral Service- 1-800-533-3463 ° °Chronic Pain Problems: °Organization         Address  Phone   Notes  °[facility]  (336) 297-2271 Patients need to be referred by their primary  care doctor.  ° °Medication Assistance: °Organization         Address  Phone   Notes  °[place] [facility] [address] °[place], [place] [address] (336) 641-8030 --Must be a resident of [place] °-- Must have NO insurance coverage whatsoever (no Medicaid/ Medicare, etc.) °-- The pt. MUST have a primary care doctor that directs their care regularly and follows them in the community °  °[facility]  (866) 331-1348   °[employer] Way  (888) 892-1162   ° °Agencies that provide inexpensive medical care: °Organization         Address  Phone   Notes  °[facility]  (336) 832-8035   °[facility]    (336) 832-7272   °[facility] [address] °[place], [place] [address] (336) 832-4777   °[facility] [address], °[place] (336) 271-4999   °[facility]    (336) 373-0678   °[facility]    (336) 272-1050   °[facility] ° [address], [place] Phone:  (336) 832-4444, Fax:  (336) 832-4440 Hours of Operation:  9 am - 6 pm, M-F.  Also accepts Medicaid/Medicare and self-pay.  °[facility] ° [address], [place] Phone: (336) 832-3150, Fax: (336) 832-3151. Hours of Operation:  8:30 am - 5:30 pm, M-F.  Also accepts Medicaid and self-pay.  °[facility] [place] [address], [place] Phone: (336) 878-6027   °[facility] [address], [place], [place] (336)723-1848, Ext. 123 Mondays & Thursdays: 7-9 AM.  First 15 patients are seen on a first come, first serve basis. °  ° °Medicaid-accepting [place] Providers: ° °Organization         Address  Phone   Notes  °[facility] [address], [place] (336) 641-2100 Also accepts self-pay patients.  °[facility] [address], [place] ° (336) 856-9996   °[facility] [address], [place] (336) 288-8857   °[facility] [address] [place] [address], [place] (336) 299-7000   °Veita Bland [address], [place]  ° (336) 373-1557 Only accepts [place] Access Medicaid patients after they have their name applied to their card.  ° °Self-Pay (no insurance) in [place]: ° °Organization         Address  Phone   Notes  °Sickle Cell Patients, [facility] [address], [place] (336) 832-1970   °[facility] [address], [place] (336) 832-4400   °[facility] ° [address], [place] (336) 992-4800   °[facility]/Dr. Osei-Bonsu ° [address] [place] [address],  or [address], [place] (336) 841-8500   number for both [place] and [place] locations is the same.  [facility] [address], [place] [phone]) [phone]   [facility] [address] [place] [address], [place] or [address] [phone]) [phone]) [phone]   [facility] [address], [place] [phone](336) [phone], phone; [phone](336) [phone], fax Sees patients 1st and 3rd Saturday of every month.  Must not qualify for public or private insurance (i.e. Medicaid, Medicare, [place] Health Choice, Veterans' Benefits)  Household income should be no more than 200% of the poverty level The clinic cannot treat you if you are pregnant or think you are pregnant  Sexually transmitted diseases are not treated at the clinic.    Dental Care: Organization         Address  Phone  Notes  [facility] [address], [place] [phone]) [phone] Accepts children up to age 49 who are enrolled in [place] or [place] Health Choice; pregnant women with a Medicaid card; and children who have applied for Medicaid or [place] Health Choice, but were declined, whose parents can pay a reduced fee at  time of service.  [facility] [place]  [address], [place] [phone]) [phone] Accepts children up to age 621 who are enrolled in [place] or [place] Health Choice; pregnant women with a Medicaid card; and children who have applied for Medicaid or [place] Health Choice, but were declined, whose parents can pay a reduced fee at time of service.  [facility]  [address], [place] [phone](336) [phone] Patients are seen by appointment only. Walk-ins are not accepted. [facility] will see patients 49 years of age and older. Monday - Tuesday (8am-5pm) Most Wednesdays (8:30-5pm) $30 per visit, cash only  [facility]  [address], [facility] [phone]) [phone] Patients are seen by appointment only. Walk-ins are not accepted. [facility] will see patients 49 years of age and older. One Wednesday Evening (Monthly: Volunteer Based).  $30 per visit, cash only  [facility]  [phone] for adults; Children under age 184, call Graduate Pediatric Dentistry at [phone]. Children aged 494-14, please call [phone] to request a pediatric application.  Dental services are provided in all areas of dental care including fillings, crowns and bridges, complete and partial dentures, implants, gum treatment, root canals, and extractions. Preventive care is also provided. Treatment is provided to both adults and children. Patients are selected via a lottery and there is often a waiting list.   [facility] [address], [place]  [phone]) [phone] [email or web address]   [facility] [address], [place], [place] [phone], Ext. [phone] Second and Fourth Thursday of each month, opens at 6:30 AM; Clinic ends at 9 AM.  Patients are seen on a first-come first-served basis, and a limited number are seen during each clinic.   [facility]  [address], [place], [place] [phone](336) [phone]   Eligibility Requirements You must have lived in [place], [place], or [place] counties for at least the last three months.   You cannot be eligible for state or federal sponsored [place]healthcare insurance, including CIGNAVeterans Administration, [place]icaid, or [employer]Medicare.   You generally cannot be eligible for healthcare insurance through your employer.    How to apply: Eligibility screenings are held every Tuesday and Wednesday afternoon from 1:00 pm  pm until 4:00 pm. You do not need an appointment for the interview!  °[facility] [address], [place], [place] [phone]   °[place] [facility]  336-342-8273   °[place]  336-703-3100   °[facility]  336-570-6415   ° °Behavioral Health Resources in the Community: °Intensive Outpatient Programs °Organization         Address  Phone  Notes  °[facility] [address], [place], [place] 336-878-6098   °[facility] [address], [place], [place] 336-832-9800   °[facility] [address], [place], [place] ° 336-882-2125   °[place] [address],  °[place], [place] 1-800-853-5163 or 336-641-4981   °Substance Abuse Resources °Organization         Address  Phone  Notes  °[facility]  336-882-2125   °[facility]  336-784-9470   °[facility]  336-285-9073   °[facility]  336-845-3988   °Residential & Outpatient Substance Abuse Program  1-800-659-3381   °Psychological Services °Organization         Address  Phone  Notes  °[facility]  336- 832-9600   °[facility]  336- 378-7881   °[facility] [address], [place] 1-800-853-5163 or 336-641-4981   ° °Mobile Crisis Teams °Organization         Address  Phone  Notes  °[facility], Mobile Crisis Care Unit  1-877-626-1772   °Assertive °Psychotherapeutic  Services ° [address] [place], [place] 336-834-9664   °Sharon DeEsch [address] °[place] 336-554-5454   ° °Self-Help/Support Groups °Organization         Address  Phone             Notes  °[facility] - variety of support groups  336- 373-1402 Call for more information  °Narcotics Anonymous (NA), [facility] [address], °[place]  2 meetings at this location  ° °Residential Treatment Programs °Organization         Address  Phone  Notes  °[facility] [address],    °[place]  1-866-801-8205   °[facility] ° [address], [place], [place] 704-293-8524   °[facility] [address], [place] 336-845-3988 Admissions: 8am-3pm M-F  °[facility] [address]    °[place], [place] 336-841-1104   °[facility] [address], [place], [place] 336-379-7146   °[facility] [address]  °[place], [place] 336-285-9073   °[facility] Intensive Outpatient [address], [place], [place] 336-852-3033   °[facility] [address]  °[place], [place] 1-877-615-2722 or 336-784-9470   °[facility] ([facility] [address] [place], [place] 336-227-7417 Accepts Medicaid  °[facility] [address]  °[place] 1-800-659-3381 Substance Abuse/Addiction Treatment  ° °[place] Behavioral Health Resources °Organization         Address  Phone  Notes  °[facility]  (888) 581-9988   °Julie Brannon, PhD [address] [place], [place]   (336) 349-5553 or (336) 951-0000   °[facility]   [address] °[place], [place] (336) 349-4454   °[facility] [address], [place],  (336) 342-8316 Insurance/Medicaid/sponsorship through [facility]  °[facility] [address]                                    [place], [place] (336)  342-8316 Therapy/tele-psych/case  °[facility] [address]  ° [place], [place] (336) 349-2233    °Dr. Arfeen  (336) 349-4544   °[facility] [place]  [employer] [place] [facility] 1) [address], [place] °2) [address], [place] °3)  [address], [place] (336) 349-3220 °(336) 342-7768 ° °(336) 342-8140   °[place] Child Abuse Hotline (336) 342-1394 or (336) 342-3537 (After Hours)    ° ° ° °

## 2015-04-14 NOTE — ED Notes (Signed)
C/o right lower dental pain, onset last pm.

## 2015-04-14 NOTE — ED Provider Notes (Signed)
CSN: [id number]     Arrival date & time 04/14/15  1058 History  This chart was scribed for non-physician practitioner, Trixie DredgeEmily Braydn Carneiro, PA-C, working with Elwin MochaBlair [place], MD by Marica OtterNusrat Rahman, ED Scribe. This patient was seen in room TR04C/TR04C and the patient's care was started at 12:14 PM.  Chief Complaint  Patient presents with  . Dental Pain   The history is provided by the patient. No language interpreter was used.   PCP: Jacquiline Doealeb Parker, MD HPI Comments: Traci Guzman is a 49 y.o. female, with PMH noted below including 4 tooth extractions in one month, who presents to the Emergency Department complaining of atraumatic, worsening, constant right, lower dental pain with associated swelling onset last night. Pt reports gurgling salt water and taking ibuprofen at home without relief. Pt denies fever, chills, trouble swallowing or breathing.   Past Medical History  Diagnosis Date  . Restless leg syndrome   . Arthritis     primarily knees  . Fibromyalgia   . Hypertension   . Dyshidrotic foot dermatitis   . Healthcare maintenance     pap 2010, mammogram 2004  . Dyshidrotic foot dermatitis   . Depression 06/24/2011  . 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 History of anemia 08/31/2011  . HTN (hypertension) 06/23/2011  . 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 Cesarean section      2x-1985 and 1988  . Cholecystectomy      1995  . Tubal ligation      1988   Family History  Problem Relation Age of Onset  . Diabetes Mother     age 49   History  Substance Use Topics  . Smoking status: Current Every Day Smoker -- 1.00 packs/day for 26 years    Types: Cigarettes    Last Attempt to Quit: 06/06/2011  . Smokeless tobacco: Not on file     Comment: Husband smokes around her  . Alcohol Use: No   OB History    No data available     Review of Systems  Constitutional: Negative for fever and chills.  HENT: Positive  for dental problem. Negative for sore throat and trouble swallowing.   Respiratory: Negative for shortness of breath.   Skin: Negative for rash.  Allergic/Immunologic: Negative for immunocompromised state.  Psychiatric/Behavioral: Negative for self-injury.      Allergies  Dairy aid; Gluten meal; and Milk-related compounds  Home Medications   Prior to Admission medications   Medication Sig Start Date End Date Taking? Authorizing Provider  amitriptyline (ELAVIL) 100 MG tablet Take 1 tablet (100 mg total) by mouth at bedtime. Takes with a 25 mg tab to total 125 mg daily 12/15/14   Ardith Darkaleb M Parker, MD  amitriptyline (ELAVIL) 25 MG tablet Take 1 tablet (25 mg total) by mouth at bedtime. Takes with 100 mg tab to total 125 mg daily 01/01/15   Ardith Darkaleb M Parker, MD  aspirin EC 81 MG tablet Take 1 tablet (81 mg total) by mouth daily. 03/31/14   Shelva MajesticStephen O Hunter, MD  cetirizine (ZYRTEC) 10 MG tablet Take 10 mg by mouth at bedtime.    Historical Provider, MD  gabapentin (NEURONTIN) 300 MG capsule Take 2 capsules (600 mg total) by mouth at bedtime. 01/01/15   Ardith Darkaleb M Parker, MD  lisinopril-hydrochlorothiazide (PRINZIDE,ZESTORETIC) 20-12.5 MG per tablet Take 1 tablet by mouth at bedtime. 01/01/15   Ardith Darkaleb M Parker, MD  Multiple Vitamin (MULTIVITAMIN) tablet Take  1 tablet by mouth at bedtime.     Historical Provider, MD  traMADol (ULTRAM) 50 MG tablet Take 1 tablet (50 mg total) by mouth every 8 (eight) hours as needed for moderate pain or severe pain. 02/26/15   Ardith Dark, MD   Triage Vitals: BP 102/69 mmHg  Pulse 113  Temp(Src) 98.8 F (37.1 C) (Oral)  Resp 20  SpO2 99%  LMP 05/19/2014 Physical Exam  Constitutional: She appears well-developed and well-nourished. No distress.  HENT:  Head: Normocephalic and atraumatic.  Mouth/Throat: Uvula is midline and oropharynx is clear and moist. Mucous membranes are not dry. No uvula swelling. No oropharyngeal exudate, posterior oropharyngeal edema, posterior  oropharyngeal erythema or tonsillar abscesses.  Widespread dental decay, lower incisors with severe decay to the level of the gumline, with edema and induration over the lower canines, gingivas, edematous, swelling over the chin, tender to palpation, lower jaws otherwise edentulous.   Neck: Trachea normal, normal range of motion and phonation normal. Neck supple. No tracheal tenderness present. No rigidity. No tracheal deviation, no edema, no erythema and normal range of motion present.  Cardiovascular: Normal rate.   Pulmonary/Chest: Effort normal and breath sounds normal. No stridor.  Lymphadenopathy:    She has no cervical adenopathy.  Neurological: She is alert.  Skin: She is not diaphoretic.  Nursing note and vitals reviewed.   ED Course  Procedures (including critical care time) DIAGNOSTIC STUDIES: Oxygen Saturation is 99% on RA, nl by my interpretation.    COORDINATION OF CARE: 12:16 PM-Discussed treatment plan which includes meds and dental referral with pt at bedside and pt agreed to plan.   Labs Review Labs Reviewed - No data to display  Imaging Review No results found.   EKG Interpretation None      MDM   Final diagnoses:  Dental abscess  Dental decay    Afebrile, nontoxic patient with new dental pain with obvious abscess. No airway concerns. Doubt Ludwig's angina.  D/C home with antibiotic, pain medication and oral surgery/dental follow up.  Discussed findings, treatment, and follow up  with patient.  Pt given return precautions.  Pt verbalizes understanding and agrees with plan.       I personally performed the services described in this documentation, which was scribed in my presence. The recorded information has been reviewed and is accurate.    Trixie Dredge, PA-C 04/14/15 1701  Elwin Mocha, MD 04/15/15 [phone]

## 2015-04-29 ENCOUNTER — Other Ambulatory Visit: Payer: Self-pay | Admitting: Family Medicine

## 2015-04-29 NOTE — Telephone Encounter (Signed)
Needs refill on tramadol  °

## 2015-05-01 MED ORDER — TRAMADOL HCL 50 MG PO TABS: 50.0000 mg | ORAL_TABLET | Freq: Three times a day (TID) | ORAL | Status: DC | PRN

## 2015-05-01 NOTE — Telephone Encounter (Signed)
Patient will come pick up medication and appt made for follow up. Licia Harl,CMA

## 2015-05-19 ENCOUNTER — Ambulatory Visit (INDEPENDENT_AMBULATORY_CARE_PROVIDER_SITE_OTHER): Payer: Self-pay | Admitting: Family Medicine

## 2015-05-19 ENCOUNTER — Encounter: Payer: Self-pay | Admitting: Family Medicine

## 2015-05-19 VITALS — BP 145/101 | HR 99 | Temp 98.3°F | Ht 66.0 in | Wt 180.6 lb

## 2015-05-19 DIAGNOSIS — L918 Other hypertrophic disorders of the skin: Secondary | ICD-10-CM | POA: Insufficient documentation

## 2015-05-19 DIAGNOSIS — I10 Essential (primary) hypertension: Secondary | ICD-10-CM

## 2015-05-19 NOTE — Progress Notes (Signed)
    Subjective:  Traci Guzman is a 49 y.o. female who presents to the [facility] today with a chief complaint of skin tags.    HPI:  Skin growths in groin. First noticed about a year ago. States that she initially thought it was a skin tag, gradually grew larger. Over the past 4-5 months has noticed more skin lesions. Lesions have also started turning purple and black. No bleeding or erythema noted. No pain. Patient's father died from skin cancer and patient is concerned current lesions may be cancerous.   Hypertension BP Readings from Last 3 Encounters:  05/19/15 145/101  04/14/15 106/68  01/01/15 106/70   Home BP monitoring-No Compliant with medications-yes without side effects ROS-Denies any CP, HA, SOB, blurry vision, LE edema, transient weakness, orthopnea, PND.   ROS: No nausea or vomiting, otherwise all systems reviewed and are negative   Objective:  Physical Exam: BP 145/101 mmHg  Pulse 99  Temp(Src) 98.3 F (36.8 C) (Oral)  Ht  (1.676 m)  Wt 180 lb 9.6 oz (81.92 kg)  BMI 29.16 kg/m2  LMP 05/19/2014  Gen: NAD, resting comfortably CV: RRR with no murmurs appreciated Lungs: NWOB, CTAB with no crackles, wheezes, or rhonchi GI: Normal bowel sounds present. Soft, Nontender, Nondistended. MSK: no edema, cyanosis, or clubbing noted Skin: Numerous pedunculated hyperpigmented skin tags noted in right groin area along skin folds.  Neuro: grossly normal, moves all extremities Psych: Normal affect and thought content  Shave Biopsy Procedure Note  Pre-operative Diagnosis: Suspicious lesion  Post-operative Diagnosis: skin tags  Locations:Right groin  Indications: Diagnostic  Anesthesia: Lidocaine 1% without epinephrine without added sodium bicarbonate  Procedure Details  History of allergy to iodine: no  Patient informed of the risks (including bleeding and infection) and benefits of the  procedure and Verbal informed consent obtained.  The lesion and  surrounding area were given a sterile prep using alcohol and draped in the usual sterile fashion. A scalpel was used to shave an area of skin approximately 1cm by 4cm.  Hemostasis achieved with pressure dressing. Antibiotic ointment and a sterile dressing applied.  The specimen was sent for pathologic examination. The patient tolerated the procedure well.  EBL: 5 ml  Findings: Skin Tags  Condition: Stable  Complications: none.  Plan: 1. Instructed to keep the wound dry and covered for 24-48h and clean thereafter. 2. Warning signs of infection were reviewed.   3. Recommended that the patient use OTC analgesics as needed for pain.  4. Return in 2 weeks.   Assessment/Plan:  Cutaneous skin tags Lesions in groin consistent with skin tags. 5 removed from right groin today via shave biopsy. Procedure note above. Will send for pathology. Instructed patient to use topical antibiotic ointment and OTC analgesics as needed. Instructed patient to return in 2-3 weeks for removal of additional skin tags if desired.   A1c 5.7 last year. Will recheck at future appointment.   HTN (hypertension) Mildly elevated today, previously very well controlled. Will continue lisinopril-HCTZ. Consider increasing at next visit if still elevated.     Katina Degree. Jimmey Ralph, MD [facility] Family Medicine Resident PGY-2 05/19/2015 5:11 PM

## 2015-05-19 NOTE — Assessment & Plan Note (Signed)
Mildly elevated today, previously very well controlled. Will continue lisinopril-HCTZ. Consider increasing at next visit if still elevated.

## 2015-05-19 NOTE — Assessment & Plan Note (Addendum)
Lesions in groin consistent with skin tags. 5 removed from right groin today via shave biopsy. Procedure note above. Will send for pathology. Instructed patient to use topical antibiotic ointment and OTC analgesics as needed. Instructed patient to return in 2-3 weeks for removal of additional skin tags if desired.   A1c 5.7 last year. Will recheck at future appointment.

## 2015-05-19 NOTE — Patient Instructions (Signed)
Thank you for coming to the clinic today. It was nice seeing you.  You biopsied your skin tags. You should either receive a call or letter from [place] within the next 1-2 weeks with the results. Given the size of the skin tags, they sometimes do not get completely removed with biopsy and we need further removal in the future.   You will have some irritation at the site of the biopsy over the next few days. You can use neosporin normal bandaids as needed.   If you wish, we can remove further skin tags in the next few weeks.   I would like to check your blood sugar for diabetes soon. You are also due for a pap smear, please let us know when you will be ready.   Biopsy Care After Refer to this sheet in the next few weeks. These instructions provide you with information on caring for yourself after your procedure. Your caregiver may also give you more specific instructions. Your treatment has been planned according to current medical practices, but problems sometimes occur. Call your caregiver if you have any problems or questions after your procedure. If you had a fine needle biopsy, you may have soreness at the biopsy site for 1 to 2 days. If you had an open biopsy, you may have soreness at the biopsy site for 3 to 4 days. HOME CARE INSTRUCTIONS   You may resume normal diet and activities as directed.  Change bandages (dressings) as directed. If your wound was closed with a skin glue (adhesive), it will wear off and begin to peel in 7 days.  Only take over-the-counter or prescription medicines for pain, discomfort, or fever as directed by your caregiver.  Ask your caregiver when you can bathe and get your wound wet. SEEK IMMEDIATE MEDICAL CARE IF:   You have increased bleeding (more than a small spot) from the biopsy site.  You notice redness, swelling, or increasing pain at the biopsy site.  You have pus coming from the biopsy site.  You have a fever.  You notice a bad smell coming from  the biopsy site or dressing.  You have a rash, have difficulty breathing, or have any allergic problems. MAKE SURE YOU:   Understand these instructions.  Will watch your condition.  Will get help right away if you are not doing well or get worse. Document Released: 04/22/2005 Document Revised: 12/26/2011 Document Reviewed: 03/31/2011 [facility] Patient Information 2015 [place], [place]. This information is not intended to replace advice given to you by your health care provider. Make sure you discuss any questions you have with your health care provider.

## 2015-05-27 ENCOUNTER — Encounter: Payer: Self-pay | Admitting: Family Medicine

## 2015-07-01 ENCOUNTER — Emergency Department (HOSPITAL_COMMUNITY)
Admission: EM | Admit: 2015-07-01 | Discharge: 2015-07-01 | Disposition: A | Discharge status: Home / Self Care | Payer: Self-pay | Attending: Emergency Medicine | Admitting: Emergency Medicine

## 2015-07-01 ENCOUNTER — Encounter (HOSPITAL_COMMUNITY): Payer: Self-pay | Admitting: Neurology

## 2015-07-01 DIAGNOSIS — Z720 Tobacco use: Secondary | ICD-10-CM | POA: Insufficient documentation

## 2015-07-01 DIAGNOSIS — Z7982 Long term (current) use of aspirin: Secondary | ICD-10-CM | POA: Insufficient documentation

## 2015-07-01 DIAGNOSIS — M797 Fibromyalgia: Secondary | ICD-10-CM | POA: Insufficient documentation

## 2015-07-01 DIAGNOSIS — I10 Essential (primary) hypertension: Secondary | ICD-10-CM | POA: Insufficient documentation

## 2015-07-01 DIAGNOSIS — K047 Periapical abscess without sinus: Secondary | ICD-10-CM | POA: Insufficient documentation

## 2015-07-01 DIAGNOSIS — F329 Major depressive disorder, single episode, unspecified: Secondary | ICD-10-CM | POA: Insufficient documentation

## 2015-07-01 DIAGNOSIS — T887XXA Unspecified adverse effect of drug or medicament, initial encounter: Secondary | ICD-10-CM

## 2015-07-01 DIAGNOSIS — T426X1A Poisoning by other antiepileptic and sedative-hypnotic drugs, accidental (unintentional), initial encounter: Secondary | ICD-10-CM | POA: Insufficient documentation

## 2015-07-01 DIAGNOSIS — M1990 Unspecified osteoarthritis, unspecified site: Secondary | ICD-10-CM | POA: Insufficient documentation

## 2015-07-01 DIAGNOSIS — Z79899 Other long term (current) drug therapy: Secondary | ICD-10-CM | POA: Insufficient documentation

## 2015-07-01 DIAGNOSIS — K029 Dental caries, unspecified: Secondary | ICD-10-CM | POA: Insufficient documentation

## 2015-07-01 MED ORDER — PENICILLIN V POTASSIUM 250 MG PO TABS: 250.0000 mg | ORAL_TABLET | Freq: Four times a day (QID) | ORAL | Status: AC

## 2015-07-01 NOTE — ED Notes (Signed)
Has swelling to left side of cheek since this morning, has many missing teeth and has been having dental pain. Also, has been taking less neurotonin than normal and has been feeling sleepy, like would fall asleep mid-conversation and reports she has felt clumsy for several weeks. Pt is a x 4. In NAD. Is ambulatory.

## 2015-07-01 NOTE — Discharge Instructions (Signed)
Abscessed Tooth An abscessed tooth is an infection around your tooth. It may be caused by holes or damage to the tooth (cavity) or a dental disease. An abscessed tooth causes mild to very bad pain in and around the tooth. See your dentist right away if you have tooth or gum pain. HOME CARE  Take your medicine as told. Finish it even if you start to feel better.  Do not drive after taking pain medicine.  Rinse your mouth (gargle) often with salt water ( teaspoon salt in 8 ounces of warm water).  Do not apply heat to the outside of your face. GET HELP RIGHT AWAY IF:   You have a temperature by mouth above 102 F (38.9 C), not controlled by medicine.  You have chills and a very bad headache.  You have problems breathing or swallowing.  Your mouth will not open.  You develop puffiness (swelling) on the neck or around the eye.  Your pain is not helped by medicine.  Your pain is getting worse instead of better. MAKE SURE YOU:   Understand these instructions.  Will watch your condition.  Will get help right away if you are not doing well or get worse. Document Released: 03/21/2008 Document Revised: 12/26/2011 Document Reviewed: 01/11/2011 [employer] Patient Information 2015 [employer]. This information is not intended to replace advice given to you by your health care provider. Make sure you discuss any questions you have with your health care provider.  

## 2015-07-01 NOTE — ED Provider Notes (Addendum)
CSN: [id number]     Arrival date & time 07/01/15  1355 History   First MD Initiated Contact with Patient 07/01/15 1537     Chief Complaint  Patient presents with  . Dental Pain  . Facial Swelling     (Consider location/radiation/quality/duration/timing/severity/associated sxs/prior Treatment) HPI Comments: Family states that today patient has been very drowsy and will fall asleep during midsentence. She was doing this about a month ago when she was still taking her Neurontin and after she stopped taking the Neurontin her drowsiness completely resolved. However 2 weeks ago her doctor put her back on Neurontin and she took tramadol today and she has been very sleepy. She denies unilateral weakness, numbness, chest pain, shortness of breath, fever, nausea, vomiting, diarrhea, abdominal pain.  Patient is a 49 y.o. female presenting with tooth pain. The history is provided by the patient and a relative.  Dental Pain Location:  Upper Upper teeth location:  8/RU central incisor Quality:  Localized and pulsating Severity:  Moderate Onset quality:  Sudden Duration:  1 day Timing:  Constant Progression:  Worsening Chronicity:  New Context: abscess and dental caries   Relieved by: tramadol. Worsened by:  Touching Ineffective treatments:  None tried Associated symptoms: facial pain and facial swelling   Associated symptoms: no difficulty swallowing and no drooling   Risk factors: lack of dental care, periodontal disease and smoking   Risk factors: no alcohol problem and no immunosuppression     Past Medical History  Diagnosis Date  . Restless leg syndrome   . Arthritis     primarily knees  . Fibromyalgia   . Hypertension   . Dyshidrotic foot dermatitis   . Healthcare maintenance     pap 2010, mammogram 2004  . Dyshidrotic foot dermatitis   . Depression 06/24/2011  . 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 History of anemia 08/31/2011  . HTN (hypertension) 06/23/2011  .  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 Cesarean section      2x-1985 and 1988  . Cholecystectomy      1995  . Tubal ligation      1988   Family History  Problem Relation Age of Onset  . Diabetes Mother     age 19   Social History  Substance Use Topics  . Smoking status: Current Every Day Smoker -- 1.00 packs/day for 26 years    Types: Cigarettes    Last Attempt to Quit: 06/06/2011  . Smokeless tobacco: None     Comment: Husband smokes around her  . Alcohol Use: No   OB History    No data available     Review of Systems  HENT: Positive for facial swelling. Negative for drooling.   All other systems reviewed and are negative.     Allergies  Dairy aid; Gluten meal; and Milk-related compounds  Home Medications   Prior to Admission medications   Medication Sig Start Date End Date Taking? Authorizing Provider  amitriptyline (ELAVIL) 100 MG tablet Take 1 tablet (100 mg total) by mouth at bedtime. Takes with a 25 mg tab to total 125 mg daily 12/15/14  Yes Ardith Dark, MD  amitriptyline (ELAVIL) 25 MG tablet Take 1 tablet (25 mg total) by mouth at bedtime. Takes with 100 mg tab to total 125 mg daily 01/01/15  Yes Ardith Dark, MD  aspirin EC 81 MG tablet Take 1 tablet (81 mg total) by mouth  daily. 03/31/14  Yes Shelva Majestic, MD  cetirizine (ZYRTEC) 10 MG tablet Take 10 mg by mouth at bedtime.   Yes Historical Provider, MD  gabapentin (NEURONTIN) 300 MG capsule Take 2 capsules (600 mg total) by mouth at bedtime. Patient taking differently: Take 300 mg by mouth at bedtime.  01/01/15  Yes Ardith Dark, MD  ibuprofen (ADVIL,MOTRIN) 200 MG tablet Take 800 mg by mouth every 6 (six) hours as needed for mild pain.   Yes Historical Provider, MD  lisinopril-hydrochlorothiazide (PRINZIDE,ZESTORETIC) 20-12.5 MG per tablet Take 1 tablet by mouth at bedtime. 01/01/15  Yes Ardith Dark, MD  Multiple Vitamin  (MULTIVITAMIN) tablet Take 1 tablet by mouth at bedtime.    Yes Historical Provider, MD  traMADol (ULTRAM) 50 MG tablet Take 1 tablet (50 mg total) by mouth every 8 (eight) hours as needed for moderate pain or severe pain. 05/01/15  Yes Ardith Dark, MD   BP 105/42 mmHg  Pulse 106  Temp(Src) 98.7 F (37.1 C) (Oral)  Resp 14  SpO2 97%  LMP 05/19/2014 Physical Exam  Constitutional: She is oriented to person, place, and time. She appears well-developed and well-nourished. No distress.  Drowsy but able to answer all questions appropriately  HENT:  Head: Normocephalic and atraumatic.    Mouth/Throat: Oropharynx is clear and moist. Dental caries present.    Eyes: Conjunctivae and EOM are normal. Pupils are equal, round, and reactive to light.  Neck: Normal range of motion. Neck supple.  Cardiovascular: Normal rate, regular rhythm and intact distal pulses.   No murmur heard. Pulmonary/Chest: Effort normal and breath sounds normal. No respiratory distress. She has no wheezes. She has no rales.  Abdominal: Soft. She exhibits no distension. There is no tenderness. There is no rebound and no guarding.  Musculoskeletal: Normal range of motion. She exhibits no edema or tenderness.  Neurological: She is alert and oriented to person, place, and time.  Skin: Skin is warm and dry. No rash noted. No erythema.  Psychiatric: She has a normal mood and affect. Her behavior is normal.  Nursing note and vitals reviewed.   ED Course  Procedures (including critical care time) Labs Review Labs Reviewed - No data to display  Imaging Review No results found. I have personally reviewed and evaluated these images and lab results as part of my medical decision-making.   EKG Interpretation None      MDM   Final diagnoses:  Dental abscess  Non-dose-related adverse reaction to medication, initial encounter   Pt with dental caries and facial swelling.  No signs of ludwig's angina or difficulty  swallowing and no systemic symptoms. Will treat with PCN and have pt f/u with oral surgeon.  Secondly family states that patient has been very drowsy today following his sleep in midsentence. Family states patient had been doing the same thing about a month ago and it was medication related. At that time she stopped her Neurontin and had been doing well until she was started back on Neurontin 2 weeks ago for fibromyalgia. Patient states today she took a tramadol, Neurontin and amitriptyline. She just complains of feeling drowsy. She denies any symptoms concerning for stroke, delirium.  She has no altered mental status she is just drowsy. She denies any alcohol or drug intake. Low suspicion for sepsis or underlying neurologic disorder. Recommended that she stop taking the Neurontin and sparingly use the tramadol. Discussed this with her family who is also at bedside and they were comfortable with  this plan.    Gwyneth Sprout, MD 07/01/15 1606  Gwyneth Sprout, MD 07/01/15 [phone]

## 2015-07-13 ENCOUNTER — Other Ambulatory Visit: Payer: Self-pay | Admitting: Family Medicine

## 2015-07-13 NOTE — Telephone Encounter (Signed)
Pt called and would like a refill on her Tramadol left up front. jw

## 2015-07-15 MED ORDER — TRAMADOL HCL 50 MG PO TABS: 50.0000 mg | ORAL_TABLET | Freq: Three times a day (TID) | ORAL | Status: DC | PRN

## 2015-07-15 NOTE — Telephone Encounter (Signed)
Rx filled and left at front of office.  Katina Degree. Jimmey Ralph, MD [facility] Family Medicine Resident PGY-2 07/15/2015 1:46 PM

## 2015-07-15 NOTE — Telephone Encounter (Signed)
Pt returned call; informed of message below. Pt expressed understanding and thanks. Sadie Reynolds, ASA

## 2015-07-15 NOTE — Telephone Encounter (Signed)
LM with pt husband to call office back. When pt calls back please left her know her Tramadol Rx is ready for PU at front desk. Adams,Latoya, CMA.

## 2015-11-15 ENCOUNTER — Emergency Department (HOSPITAL_COMMUNITY)
Admission: EM | Admit: 2015-11-15 | Discharge: 2015-11-15 | Disposition: A | Discharge status: Home / Self Care | Payer: Self-pay | Attending: Emergency Medicine | Admitting: Emergency Medicine

## 2015-11-15 ENCOUNTER — Encounter (HOSPITAL_COMMUNITY): Payer: Self-pay | Admitting: *Deleted

## 2015-11-15 DIAGNOSIS — F17210 Nicotine dependence, cigarettes, uncomplicated: Secondary | ICD-10-CM | POA: Insufficient documentation

## 2015-11-15 DIAGNOSIS — I10 Essential (primary) hypertension: Secondary | ICD-10-CM | POA: Insufficient documentation

## 2015-11-15 DIAGNOSIS — M797 Fibromyalgia: Secondary | ICD-10-CM | POA: Insufficient documentation

## 2015-11-15 DIAGNOSIS — K047 Periapical abscess without sinus: Secondary | ICD-10-CM | POA: Insufficient documentation

## 2015-11-15 DIAGNOSIS — Z7982 Long term (current) use of aspirin: Secondary | ICD-10-CM | POA: Insufficient documentation

## 2015-11-15 DIAGNOSIS — F329 Major depressive disorder, single episode, unspecified: Secondary | ICD-10-CM | POA: Insufficient documentation

## 2015-11-15 DIAGNOSIS — Z79899 Other long term (current) drug therapy: Secondary | ICD-10-CM | POA: Insufficient documentation

## 2015-11-15 DIAGNOSIS — Z872 Personal history of diseases of the skin and subcutaneous tissue: Secondary | ICD-10-CM | POA: Insufficient documentation

## 2015-11-15 DIAGNOSIS — Z862 Personal history of diseases of the blood and blood-forming organs and certain disorders involving the immune mechanism: Secondary | ICD-10-CM | POA: Insufficient documentation

## 2015-11-15 DIAGNOSIS — G2581 Restless legs syndrome: Secondary | ICD-10-CM | POA: Insufficient documentation

## 2015-11-15 DIAGNOSIS — M1990 Unspecified osteoarthritis, unspecified site: Secondary | ICD-10-CM | POA: Insufficient documentation

## 2015-11-15 MED ORDER — IBUPROFEN 800 MG PO TABS: 800.0000 mg | ORAL_TABLET | Freq: Three times a day (TID) | ORAL | Status: DC

## 2015-11-15 MED ORDER — TRAMADOL HCL 50 MG PO TABS
100.0000 mg | ORAL_TABLET | Freq: Once | ORAL | Status: AC
  Administered 2015-11-15: 100 mg via ORAL
  Filled 2015-11-15: qty 2

## 2015-11-15 MED ORDER — CLINDAMYCIN HCL 150 MG PO CAPS: 450.0000 mg | ORAL_CAPSULE | Freq: Three times a day (TID) | ORAL | Status: DC

## 2015-11-15 MED ORDER — CLINDAMYCIN HCL 150 MG PO CAPS
450.0000 mg | ORAL_CAPSULE | Freq: Once | ORAL | Status: AC
  Administered 2015-11-15: 450 mg via ORAL
  Filled 2015-11-15: qty 3

## 2015-11-15 NOTE — ED Notes (Signed)
Pt reports swelling to RT side of face due to a broken tooth on RT lower side. Pt reports the swelling as increased since this AM. Pt does report to have a Education officer, community .

## 2015-11-15 NOTE — Discharge Instructions (Signed)
You have been seen today for a likely dental abscess. Follow up with PCP as needed. Return to ED should symptoms worsen. You must be evaluated by a dentist as soon as possible. Please take all of your antibiotics until finished!   You may develop abdominal discomfort or diarrhea from the antibiotic.  You may help offset this with probiotics which you can buy or get in yogurt. Do not eat or take the probiotics until 2 hours after your antibiotic.

## 2015-11-15 NOTE — ED Provider Notes (Signed)
CSN: [id number]     Arrival date & time 11/15/15  1826 History   By signing my name below, I, Traci Guzman, attest that this documentation has been prepared under the direction and in the presence of non-physician practitioner, Harolyn Rutherford, PA-C. Electronically Signed: Freida Guzman, Scribe. 11/15/2015. 6:58 PM.    Chief Complaint  Patient presents with  . Facial Swelling    The history is provided by the patient. No language interpreter was used.     HPI Comments:  Traci Guzman is a 50 y.o. female who presents to the Emergency Department complaining of moderate right sided facial swelling since ~0730 this AM. She notes the swelling has progressively worsened since onset. She reports associated  6-7/10 throbbing pain at the site, nonradiating. She also reports h/o tooth pain to that side of her mouth.  She denies fever, chills, nausea, vomiting, difficulty swallowing or breathing, or any other complaints. No alleviating factors.   Past Medical History  Diagnosis Date  . Restless leg syndrome   . Arthritis     primarily knees  . Fibromyalgia   . Hypertension   . Dyshidrotic foot dermatitis   . Healthcare maintenance     pap 2010, mammogram 2004  . Dyshidrotic foot dermatitis   . Depression 06/24/2011  . 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 History of anemia 08/31/2011  . HTN (hypertension) 06/23/2011  . 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 Cesarean section      2x-1985 and 1988  . Cholecystectomy      1995  . Tubal ligation      1988   Family History  Problem Relation Age of Onset  . Diabetes Mother     age 41   Social History  Substance Use Topics  . Smoking status: Current Every Day Smoker -- 1.00 packs/day for 26 years    Types: Cigarettes    Last Attempt to Quit: 06/06/2011  . Smokeless tobacco: None     Comment: Husband smokes around her  . Alcohol Use: No   OB History    No data  available     Review of Systems  Constitutional: Negative for fever and chills.  HENT: Positive for facial swelling. Negative for trouble swallowing.   Respiratory: Negative for shortness of breath.   Gastrointestinal: Negative for nausea and vomiting.    Allergies  Dairy aid; Gluten meal; and Milk-related compounds  Home Medications   Prior to Admission medications   Medication Sig Start Date End Date Taking? Authorizing Provider  amitriptyline (ELAVIL) 100 MG tablet Take 1 tablet (100 mg total) by mouth at bedtime. Takes with a 25 mg tab to total 125 mg daily 12/15/14   Ardith Dark, MD  amitriptyline (ELAVIL) 25 MG tablet Take 1 tablet (25 mg total) by mouth at bedtime. Takes with 100 mg tab to total 125 mg daily 01/01/15   Ardith Dark, MD  aspirin EC 81 MG tablet Take 1 tablet (81 mg total) by mouth daily. 03/31/14   Shelva Majestic, MD  cetirizine (ZYRTEC) 10 MG tablet Take 10 mg by mouth at bedtime.    Historical Provider, MD  clindamycin (CLEOCIN) 150 MG capsule Take 3 capsules (450 mg total) by mouth 3 (three) times daily. 11/15/15   Priyanka Causey C Jaedynn Bohlken, PA-C  gabapentin (NEURONTIN) 300 MG capsule Take 2 capsules (600 mg total) by mouth at bedtime. Patient taking differently: Take 300 mg  by mouth at bedtime.  01/01/15   Ardith Dark, MD  ibuprofen (ADVIL,MOTRIN) 200 MG tablet Take 800 mg by mouth every 6 (six) hours as needed for mild pain.    Historical Provider, MD  ibuprofen (ADVIL,MOTRIN) 800 MG tablet Take 1 tablet (800 mg total) by mouth 3 (three) times daily. 11/15/15   Emonie Espericueta C Jessiah Wojnar, PA-C  lisinopril-hydrochlorothiazide (PRINZIDE,ZESTORETIC) 20-12.5 MG per tablet Take 1 tablet by mouth at bedtime. 01/01/15   Ardith Dark, MD  Multiple Vitamin (MULTIVITAMIN) tablet Take 1 tablet by mouth at bedtime.     Historical Provider, MD  traMADol (ULTRAM) 50 MG tablet Take 1 tablet (50 mg total) by mouth every 8 (eight) hours as needed for moderate pain or severe pain. 07/15/15   Ardith Dark, MD   BP 132/77 mmHg  Pulse 96  Temp(Src) 98.5 F (36.9 C) (Oral)  Resp 16  SpO2 98%  LMP 05/19/2014 Physical Exam  Constitutional: She is oriented to person, place, and time. She appears well-developed and well-nourished. No distress.  HENT:  Head: Normocephalic and atraumatic.  Edema noted to buccal mucosa and inbewteen buccal and ginvial mucosa on lower right side; No area of fluctuance or induration. Pt can open mouth 3 finger width apart. No difficulty breathing. She is able to swallow secretions. Swelling noted to lower right jaw ~ the size of a ping pong ball. No swelling extending into upper jaw, back towards ear, or upper throat. No cervical lymphadenopathy. Extremely poor dentition. No intact teeth on upper or lower jaw. Extensive dental caries noted in the few teeth remaining.  Eyes: Conjunctivae are normal.  Neck: Normal range of motion. Neck supple. No tracheal deviation present.  Cardiovascular: Normal rate.   Pt is not tachycardic on exam   Pulmonary/Chest: Effort normal. No stridor.  Lymphadenopathy:    She has no cervical adenopathy.  Neurological: She is alert and oriented to person, place, and time.  Skin: Skin is warm and dry.  Psychiatric: She has a normal mood and affect.  Nursing note and vitals reviewed.   ED Course  Procedures   DIAGNOSTIC STUDIES:  Oxygen Saturation is 98% on RA, normal by my interpretation.    COORDINATION OF CARE:  6:34 PM Discussed treatment plan with pt at bedside and pt agreed to plan.    MDM   Final diagnoses:  Dental abscess    Traci Guzman presents with facial swelling and tooth pain noticed this morning.   Patient is afebrile.  Patient with likely dental abscess. Abscess does not warrant drainage at this time.  Supportive care and return precautions discussed. Pt advised pt to follow up with dentist as soon as possible.  Pt sent home with clindamycin and ibuprofen. The patient appears reasonably screened  and/or stabilized for discharge and I doubt any other emergent medical condition requiring further screening, evaluation, or treatment in the ED prior to discharge.  I personally performed the services described in this documentation, which was scribed in my presence. The recorded information has been reviewed and is accurate.  Filed Vitals:   11/15/15 1844 11/15/15 1912  BP: 136/67 132/77  Pulse: 107 96  Temp: 98.6 F (37 C) 98.5 F (36.9 C)  TempSrc: Oral Oral  Resp: 18 16  SpO2: 98% 98%     Anselm Pancoast, PA-C 11/15/15 1916  Anselm Pancoast, PA-C 11/15/15 1917  Rolan Bucco, MD 11/15/15 [phone]

## 2015-11-27 ENCOUNTER — Other Ambulatory Visit: Payer: Self-pay | Admitting: Family Medicine

## 2015-11-27 DIAGNOSIS — M797 Fibromyalgia: Secondary | ICD-10-CM

## 2015-11-27 NOTE — Telephone Encounter (Signed)
Pt called and needs a refill on her Tramadol and Amitriptyline. Please let patient when done. jw

## 2015-11-30 MED ORDER — TRAMADOL HCL 50 MG PO TABS: 50.0000 mg | ORAL_TABLET | Freq: Three times a day (TID) | ORAL | Status: DC | PRN

## 2015-11-30 MED ORDER — AMITRIPTYLINE HCL 100 MG PO TABS: 100.0000 mg | ORAL_TABLET | Freq: Every day | ORAL | Status: DC

## 2015-11-30 MED ORDER — AMITRIPTYLINE HCL 25 MG PO TABS: 25.0000 mg | ORAL_TABLET | Freq: Every day | ORAL | Status: DC

## 2015-11-30 NOTE — Telephone Encounter (Signed)
Spoke with patient and she is aware that script is ready for pick up. Traci Guzman,CMA  

## 2015-11-30 NOTE — Telephone Encounter (Signed)
Rx filled and left at front of office. Patient needs an office visit for further refills.  Traci Guzman. Jimmey Ralph, MD [facility] Family Medicine Resident PGY-2 11/30/2015 9:57 AM

## 2016-01-12 ENCOUNTER — Other Ambulatory Visit: Payer: Self-pay | Admitting: *Deleted

## 2016-01-12 DIAGNOSIS — I159 Secondary hypertension, unspecified: Secondary | ICD-10-CM

## 2016-01-12 MED ORDER — LISINOPRIL-HYDROCHLOROTHIAZIDE 20-12.5 MG PO TABS: 1.0000 | ORAL_TABLET | Freq: Every day | ORAL | Status: DC

## 2016-01-12 NOTE — Telephone Encounter (Signed)
Rx filled.  Traci Degreealeb M. Jimmey RalphParker, MD [facility] Resident PGY-2 01/12/2016 3:25 PM

## 2016-01-12 NOTE — Telephone Encounter (Signed)
LM for patient to call back and schedule an appt to follow up on her blood pressure. Avaneesh Pepitone,CMA

## 2016-05-26 ENCOUNTER — Telehealth: Payer: Self-pay | Admitting: *Deleted

## 2016-05-26 NOTE — Telephone Encounter (Signed)
Made pt an appt to meet with Dr. Jimmey RalphParker. Sunday SpillersSharon T Jamileth Putzier, CMA

## 2016-05-26 NOTE — Telephone Encounter (Signed)
This is correct; patient needs to be seen for refills on controlled substances.  Please let patient know.

## 2016-05-26 NOTE — Telephone Encounter (Signed)
Spoke with pt about her son and while on the phone she wanted me to see about getting a refill on her Tramadol.  Told pt it would probably require a office visit but that I would send PCP a message. Lamonte SakaiZimmerman Rumple, April D, [place]

## 2016-06-03 ENCOUNTER — Ambulatory Visit (INDEPENDENT_AMBULATORY_CARE_PROVIDER_SITE_OTHER): Payer: Self-pay | Admitting: Family Medicine

## 2016-06-03 ENCOUNTER — Ambulatory Visit (HOSPITAL_COMMUNITY)
Admission: RE | Admit: 2016-06-03 | Discharge: 2016-06-03 | Disposition: A | Discharge status: Home / Self Care | Payer: Self-pay | Source: Ambulatory Visit | Attending: Family Medicine | Admitting: Family Medicine

## 2016-06-03 ENCOUNTER — Encounter: Payer: Self-pay | Admitting: Family Medicine

## 2016-06-03 VITALS — BP 102/67 | HR 110 | Temp 98.6°F | Wt 223.0 lb

## 2016-06-03 DIAGNOSIS — I10 Essential (primary) hypertension: Secondary | ICD-10-CM

## 2016-06-03 DIAGNOSIS — R079 Chest pain, unspecified: Secondary | ICD-10-CM | POA: Insufficient documentation

## 2016-06-03 DIAGNOSIS — M797 Fibromyalgia: Secondary | ICD-10-CM

## 2016-06-03 DIAGNOSIS — R000 Tachycardia, unspecified: Secondary | ICD-10-CM | POA: Insufficient documentation

## 2016-06-03 MED ORDER — LISINOPRIL 20 MG PO TABS: 20.0000 mg | ORAL_TABLET | Freq: Every day | ORAL | 3 refills | Status: DC

## 2016-06-03 MED ORDER — CYCLOBENZAPRINE HCL 5 MG PO TABS: 5.0000 mg | ORAL_TABLET | Freq: Every day | ORAL | 1 refills | Status: DC

## 2016-06-03 MED ORDER — ASPIRIN EC 81 MG PO TBEC: 81.0000 mg | DELAYED_RELEASE_TABLET | Freq: Every day | ORAL | 11 refills | Status: DC

## 2016-06-03 MED ORDER — AMITRIPTYLINE HCL 50 MG PO TABS: 50.0000 mg | ORAL_TABLET | Freq: Every day | ORAL | 3 refills | Status: DC

## 2016-06-03 MED ORDER — TRAMADOL HCL 50 MG PO TABS: 50.0000 mg | ORAL_TABLET | Freq: Three times a day (TID) | ORAL | 2 refills | Status: DC | PRN

## 2016-06-03 NOTE — Progress Notes (Addendum)
    Subjective:  Traci Guzman is a 50 y.o. female who presents to the [facility] today with a chief complaint of chronic pain.   HPI:  Chronic Pain Longstanding history secondary to fibromyalgia. Currently on amitriptyline and tramadol. Stopped gabapentin. Was able to wean down her amitriptyline to '50mg'$  nightly. Says that her symptoms are currently fairly well controlled with the amitriptyline and tramadol, however recently has been getting cramps "all over." These cramps are associated with movement. Mostly located in her feet, calves, hips, and shoulders. The pain is sharp and lasts for a few minutes. She has not been exercising. She was able to go hiking recently without any issues.   Hypertension BP Readings from Last 3 Encounters:  06/03/16 102/67  11/15/15 132/77  07/01/15 102/60   Home BP monitoring-No Compliant with medications-yes, without side effects ROS-Reports occasional CP and SOB (see below). No  HA, blurry vision, LE edema, transient weakness, orthopnea, PND.   Chest Pain Patient also reports that she has had intermittent substernal chest pain for the past several months that is worse with movement and improved with rest. Pain is described as a sharp pain that can last for several minutes and radiates into her left shoulder and left jaw. She frequently has shortness of breath with these episodes. No diaphoresis or vomiting.   ROS: Per HPI  Objective:  Physical Exam: BP 102/67   Pulse (!) 110   Temp 98.6 F (37 C) (Oral)   Wt 223 lb (101.2 kg)   LMP 05/19/2014   SpO2 97%   BMI 35.99 kg/m   Gen: NAD, resting comfortably CV: RRR with no murmurs appreciated, chest wall nontender to palpation Pulm: NWOB, CTAB with no crackles, wheezes, or rhonchi GI: Normal bowel sounds present. Soft, Nontender, Nondistended. MSK: Several trigger points noted on patient's calves, hips, and shoulders with mild associated muscle spasm Skin: warm, dry Neuro: grossly normal, moves all  extremities Psych: Normal affect and thought content  EKG: NSR, no ischemic changes  Assessment/Plan:  Fibromyalgia Continue tramadol and amitriptyline. Unclear if recent spasms and muscle pain related to fibromyalgia or new pathology. She does have a significant amount of spasm and trigger points on exam. Will try flexeril. No other red flag signs or symptoms. If symptoms not improved, consider increasing dose of amitriptyline, adding additional agent such as lyrica. Can also consider trigger point injections. If pain significantly worsening, may consider further work up with ESR, CRP, ANA, and CBC.  HTN (hypertension) Low today. Has been having occasional dizziness. Will stop HCTZ. Continue lisinopril '20mg'$  daily.   Chest pain Likely a component of her chronic pain and fibromyalgia, however patient with several risk factors including tobacco abuse and obesity. EKG today within normal limits. Given exertional nature and risk factors, will obtain stress test. Recommended patient to continue aspirin '81mg'$  daily. Deferred risk stratefication labs today per patient request as she is currently without insurance. Will check these as soon as possible.    Algis Greenhouse. Jerline Pain, [facility] Resident PGY-3 06/03/2016 5:05 PM

## 2016-06-03 NOTE — Assessment & Plan Note (Signed)
Likely a component of her chronic pain and fibromyalgia, however patient with several risk factors including tobacco abuse and obesity. EKG today within normal limits. Given exertional nature and risk factors, will obtain stress test. Recommended patient to continue aspirin 81mg  daily. Deferred risk stratefication labs today per patient request as she is currently without insurance. Will check these as soon as possible.

## 2016-06-03 NOTE — Assessment & Plan Note (Signed)
Continue tramadol and amitriptyline. Unclear if recent spasms and muscle pain related to fibromyalgia or new pathology. She does have a significant amount of spasm and trigger points on exam. Will try flexeril. No other red flag signs or symptoms. If symptoms not improved, consider increasing dose of amitriptyline, adding additional agent such as lyrica. Can also consider trigger point injections. If pain significantly worsening, may consider further work up with ESR, CRP, ANA, and CBC.

## 2016-06-03 NOTE — Patient Instructions (Addendum)
We will refill your tramadol and amitryptyline today.  We we start flexeril for your cramps.  If your are still having significant pain over the next few weeks, let us know.  We will change your blood pressure medication to lisinopril only today.  Please come back soon for your regular check up.  I think you should have a stress test. You should get a call about scheduling this soon. If you start having chest pain or shortness of breath that does not go away, please seek medical care immediately.   Take care,  Dr Jimmey RalphParker

## 2016-06-03 NOTE — Assessment & Plan Note (Signed)
Low today. Has been having occasional dizziness. Will stop HCTZ. Continue lisinopril 20mg  daily.

## 2016-06-06 ENCOUNTER — Telehealth: Payer: Self-pay | Admitting: *Deleted

## 2016-06-06 NOTE — Telephone Encounter (Signed)
-----   Message from Ardith Darkaleb M Parker, MD sent at 06/03/2016  6:41 PM EDT ----- Needs stress test scheduled.

## 2016-06-06 NOTE — Telephone Encounter (Signed)
Tried calling [facility] but was on hold with no answer.  Will continue to reach them regarding self pay cost for this test since patient doesn't have any insurance or financial coverage. Jovanne Riggenbach,CMA

## 2016-06-09 NOTE — Telephone Encounter (Signed)
Spoke with [facility] and procedure cost is about $600.  Patient currently doesn't have any insurance coverage and is hoping to have some in the next month.  I also left a message with Charmaine at [facility] who handles their insurance and payments at [phone] to see what patient's options might be as far as payment plan vs self pay discount.  Patient is aware of this also and will wait to hear back from me on what is available. Jazmin Hartsell,CMA

## 2016-08-19 ENCOUNTER — Other Ambulatory Visit: Payer: Self-pay | Admitting: Family Medicine

## 2016-08-19 NOTE — Telephone Encounter (Signed)
Patient is a couple weeks early for her refill request. She had a 3 month supply given on 06/03/2016. She will have to wait until then for a refill.  Katina Degreealeb M. Jimmey RalphParker, MD [facility] Resident PGY-3 08/19/2016 2:32 PM

## 2016-08-19 NOTE — Telephone Encounter (Signed)
Spoke with patient, informed her she would need to wait a few more weeks in order to have her Rx for tramadol refilled. Patient stated she would call back around that time and request the refill. Maryjean Mornempestt S Roberts, CMA

## 2016-08-19 NOTE — Telephone Encounter (Signed)
Needs refill on tramadol. Please call when ready to be picked up

## 2016-08-31 ENCOUNTER — Other Ambulatory Visit: Payer: Self-pay | Admitting: Family Medicine

## 2016-08-31 MED ORDER — TRAMADOL HCL 50 MG PO TABS: 50.0000 mg | ORAL_TABLET | Freq: Three times a day (TID) | ORAL | 2 refills | Status: DC | PRN

## 2016-08-31 NOTE — Telephone Encounter (Signed)
Pt needs a refill on tramadol. Please advise. Thanks! ep

## 2016-09-01 NOTE — Telephone Encounter (Signed)
LVM for pt. To call back to the office. Garrel RidgelAmanda Cheek  Student Medical Assistant

## 2016-11-28 ENCOUNTER — Other Ambulatory Visit: Payer: Self-pay | Admitting: Family Medicine

## 2016-11-28 MED ORDER — TRAMADOL HCL 50 MG PO TABS: 50.0000 mg | ORAL_TABLET | Freq: Three times a day (TID) | ORAL | 2 refills | Status: DC | PRN

## 2016-11-28 NOTE — Telephone Encounter (Signed)
Rx filled. Patient needs office appointment.  Katina Degreealeb M. Jimmey RalphParker, MD [facility] Resident PGY-3 11/28/2016 3:43 PM

## 2016-11-28 NOTE — Telephone Encounter (Signed)
Pt needs a refill on tramadol. ep °

## 2016-11-30 ENCOUNTER — Other Ambulatory Visit: Payer: Self-pay | Admitting: Family Medicine

## 2016-11-30 NOTE — Telephone Encounter (Signed)
Called pt and left detailed VM informing her of rx refill and to schedule a FU apt with Jimmey RalphParker. If pt calls back please assist her scheduling.

## 2016-11-30 NOTE — Telephone Encounter (Signed)
Patient had refill placed earlier this week.  Katina Degreealeb M. Jimmey RalphParker, MD [facility] Resident PGY-3 11/30/2016 10:27 AM

## 2016-11-30 NOTE — Telephone Encounter (Signed)
Pt needs a refill on Tramadol. ep

## 2017-02-21 ENCOUNTER — Encounter: Payer: Self-pay | Admitting: Family Medicine

## 2017-02-21 ENCOUNTER — Ambulatory Visit (INDEPENDENT_AMBULATORY_CARE_PROVIDER_SITE_OTHER): Payer: Self-pay | Admitting: Family Medicine

## 2017-02-21 DIAGNOSIS — I10 Essential (primary) hypertension: Secondary | ICD-10-CM

## 2017-02-21 DIAGNOSIS — M797 Fibromyalgia: Secondary | ICD-10-CM

## 2017-02-21 DIAGNOSIS — Z0000 Encounter for general adult medical examination without abnormal findings: Secondary | ICD-10-CM

## 2017-02-21 MED ORDER — LISINOPRIL 20 MG PO TABS: 20.0000 mg | ORAL_TABLET | Freq: Every day | ORAL | 3 refills | Status: DC

## 2017-02-21 MED ORDER — ASPIRIN EC 81 MG PO TBEC: 81.0000 mg | DELAYED_RELEASE_TABLET | Freq: Every day | ORAL | 11 refills | Status: DC

## 2017-02-21 MED ORDER — TRAMADOL HCL 50 MG PO TABS: 50.0000 mg | ORAL_TABLET | Freq: Three times a day (TID) | ORAL | 2 refills | Status: DC | PRN

## 2017-02-21 MED ORDER — AMITRIPTYLINE HCL 50 MG PO TABS: 50.0000 mg | ORAL_TABLET | Freq: Every day | ORAL | 3 refills | Status: DC

## 2017-02-21 MED ORDER — CYCLOBENZAPRINE HCL 5 MG PO TABS
5.0000 mg | ORAL_TABLET | Freq: Every day | ORAL | 1 refills | Status: DC
Start: 2017-02-21 — End: 2018-01-25

## 2017-02-21 NOTE — Assessment & Plan Note (Signed)
Stable. Continue tramadol and amitriptyline. Will also refill flexeril as this has worked well for her back pain.

## 2017-02-21 NOTE — Assessment & Plan Note (Signed)
Due for pap, colonoscopy, and mammogram. Discussed with patient. She deferred until able to get financial assitance.

## 2017-02-21 NOTE — Assessment & Plan Note (Signed)
At goal today. Continue lisinopril. 

## 2017-02-21 NOTE — Patient Instructions (Signed)
We refilled your meds today.  Come back when you get the orange card.  You are due for blood work, your colonoscopy, and mammogram.  Take care,  Dr Jimmey RalphParker

## 2017-02-21 NOTE — Progress Notes (Signed)
   Subjective:  Traci Guzman is a 51 y.o. female who [facility] with a chief cAlexis Frockomplaint of hypertension.   HPI:  Hypertension BP Readings from Last 3 Encounters:  02/21/17 108/75  06/03/16 102/67  11/15/15 132/77   Home BP monitoring-Yes Compliant with medications-yes, without side effects ROS-Denies any CP, HA, SOB, blurry vision, LE edema, transient weakness, orthopnea, PND.   Chronic Pain Secondary to fibromyalgia. Currently on amitriptyline and tramdol which she is tolerating without side effects. Pain is overall stable. No weight loss. No fevers or chills.   HCM Due for pap, mammogram, and colonoscopy.    ROS: Per HPI  PMH: Smoking history reviewed.   Objective:  Physical Exam: BP 108/75   Pulse 92   Temp 98.7 F (37.1 C) (Oral)   Wt 195 lb (88.5 kg)   LMP 05/19/2014   SpO2 99%   BMI 31.47 kg/m   Gen: NAD, resting comfortably CV: RRR with no murmurs appreciated Pulm: NWOB, CTAB with no crackles, wheezes, or rhonchi GI: Normal bowel sounds present. Soft, Nontender, Nondistended. MSK: no edema, cyanosis, or clubbing noted Skin: warm, dry Neuro: grossly normal, moves all extremities Psych: Normal affect and thought content   Assessment/Plan:  HTN (hypertension) At goal today. Continue lisinopril.   Fibromyalgia Stable. Continue tramadol and amitriptyline. Will also refill flexeril as this has worked well for her back pain.   Healthcare maintenance Due for pap, colonoscopy, and mammogram. Discussed with patient. She deferred until able to get financial assitance.   Katina Degreealeb M. Jimmey RalphParker, MD [facility] Resident PGY-3 02/21/2017 5:16 PM

## 2017-05-17 ENCOUNTER — Telehealth: Payer: Self-pay | Admitting: Family Medicine

## 2017-05-17 NOTE — Telephone Encounter (Signed)
ptcalling to request refill of:  Name of Medication(s):  Tramadol Last date of OV:  02-21-17 Pharmacy:  Will pick up-please call when ready  Will route refill request to Clinic RN.  Discussed with patient policy to call pharmacy for future refills.  Also, discussed refills may take up to 48 hours to approve or deny.  Avanell ShackletonHarriet C Shelton

## 2017-05-22 MED ORDER — TRAMADOL HCL 50 MG PO TABS: 50.0000 mg | ORAL_TABLET | Freq: Three times a day (TID) | ORAL | 2 refills | Status: DC | PRN

## 2017-05-22 NOTE — Telephone Encounter (Signed)
Will refill this time. Patient will need to be seen for further Rx refill.

## 2017-05-22 NOTE — Telephone Encounter (Signed)
2nd request. ep °

## 2017-05-31 NOTE — Telephone Encounter (Signed)
Refill called into [facility] dated for 05/22/2017. Patient aware to schedule appt for more refills.  Clovis PuMartin, Yaneisy Wenz L, RN

## 2017-05-31 NOTE — Telephone Encounter (Signed)
Patient called again today requesting refill on tramadol.  Reviewed patient's record, medication was approved for refill on 05/22/17 and stated print.  Rx was not up front for pickup or faxes.  Please advise.  Clovis PuMartin, Tamika L, RN

## 2017-09-11 ENCOUNTER — Other Ambulatory Visit: Payer: Self-pay | Admitting: *Deleted

## 2017-09-11 ENCOUNTER — Other Ambulatory Visit: Payer: Self-pay | Admitting: Family Medicine

## 2017-09-11 ENCOUNTER — Telehealth: Payer: Self-pay

## 2017-09-11 MED ORDER — TRAMADOL HCL 50 MG PO TABS: 50.0000 mg | ORAL_TABLET | Freq: Three times a day (TID) | ORAL | 0 refills | Status: DC | PRN

## 2017-09-11 NOTE — Telephone Encounter (Signed)
Pt contacted and informed of rx ready for pick up, tramadol. Pt was appreciative and voiced understanding.

## 2017-09-11 NOTE — Telephone Encounter (Signed)
-----   Message from Garnette GunnerAaron B Thompson, MD sent at 09/11/2017  1:52 PM EST ----- Please call pt and let her know that her that her tramadol Rx is at the front desk will only fill for 2 mo while waiting for an apt.

## 2017-09-11 NOTE — Telephone Encounter (Signed)
Patient informed.  Harrell Niehoff,CMA  

## 2017-09-11 NOTE — Telephone Encounter (Signed)
Patient LM on nurse line requesting a refill of her tramadol.  She is unable to make an appointment with PCP until January due to his December appointments are already full.  Will forward to MD to advise.  Semaja Lymon,CMA

## 2017-10-23 ENCOUNTER — Encounter: Payer: Self-pay | Admitting: Family Medicine

## 2017-10-23 ENCOUNTER — Ambulatory Visit (INDEPENDENT_AMBULATORY_CARE_PROVIDER_SITE_OTHER): Payer: Self-pay | Admitting: Family Medicine

## 2017-10-23 ENCOUNTER — Other Ambulatory Visit: Payer: Self-pay

## 2017-10-23 VITALS — BP 110/68 | HR 93 | Temp 98.3°F | Ht 65.5 in | Wt 189.0 lb

## 2017-10-23 DIAGNOSIS — I10 Essential (primary) hypertension: Secondary | ICD-10-CM

## 2017-10-23 DIAGNOSIS — F5102 Adjustment insomnia: Secondary | ICD-10-CM

## 2017-10-23 DIAGNOSIS — M797 Fibromyalgia: Secondary | ICD-10-CM

## 2017-10-23 MED ORDER — TRAMADOL HCL 50 MG PO TABS: 50.0000 mg | ORAL_TABLET | Freq: Three times a day (TID) | ORAL | 0 refills | Status: AC | PRN

## 2017-10-23 MED ORDER — TRAMADOL HCL 50 MG PO TABS: 50.0000 mg | ORAL_TABLET | Freq: Three times a day (TID) | ORAL | 0 refills | Status: DC | PRN

## 2017-10-23 MED ORDER — AMITRIPTYLINE HCL 50 MG PO TABS: 100.0000 mg | ORAL_TABLET | Freq: Every day | ORAL | 0 refills | Status: DC

## 2017-10-23 NOTE — Progress Notes (Signed)
Subjective:  Traci Guzman is a 52 y.o. female who [facility] today with a chief complaint of insomnia and medication refill.    HPI:  Hypertension BP at home around 110/70.  Home BP monitoring-Yes Compliant with medications-yes, without side effects ROS-Denies any CP, HA, SOB, blurry vision, LE edema, transient weakness, orthopnea, PND.   Chronic Pain Secondary to fibromyalgia. Currently on amitriptyline and tramdol which she is tolerating without side effects. Pain is overall stable. No weight loss. No fevers or chills. Pain is primarily in shoulder blades. Pt has been on higher doses up to 100 mg. Pt recently "pulled" left back muscle out about 1 week ago. Muscle feels like a "strain". Patient medications are still working for them.   Insomnia Not sleeping through night since June. Tries to go to bed by 11:30. Wake sup at night every hour for 15 minutes. No racing thoughts. Pt has excessive worrying. 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 Pt has h/o of bipolar and recent diagnosis of COPD. Patient lost dog 1 week ago, her "baby". GAD7 10, but difficulty is minimal. PHQ9 is 9, but symptoms are primarily related to sleep. Denies racing thoughts at night.   ROS: Per HPI  PMH: Smoking history reviewed.   Objective:  Physical Exam: BP 110/68   Pulse 93   Temp 98.3 F (36.8 C) (Oral)   Ht 5' 5.5" (1.664 m)   Wt 189 lb (85.7 kg)   LMP 05/19/2014   SpO2 99%   BMI 30.97 kg/m   Gen: NAD, resting comfortably CV: RRR with no murmurs appreciated Pulm: NWOB, CTAB with no crackles, wheezes, or rhonchi GI: Normal bowel sounds present. Soft, Nontender, Nondistended. MSK: able to bend forward and almost touch toes without pain, ROM in tact, no midline tenderness to back, no edema, cyanosis, or clubbing noted Skin: warm, dry Neuro: grossly normal, moves all extremities Psych: Normal affect and thought content  No results found for this or any previous visit (from the past 72  hour(s)).   Assessment/Plan:  Fibromyalgia Patient current pain regimen is stable. Will refill tramadol.   Adjustment insomnia Patient has been experiencing more insomnia since the lost of her animal last week. Pt GAD7 and PHQ9 are mildly positive, mainly around not sleeping. Pt on 50 mg amitriptyline and uses it for fibromyalgia, mood, and insomnia. She has tried 100 mg in the past and it helped her. Will increase to 100 mg and reassess in 1 month w/ follow up.   HTN (hypertension) htn stable on current regimen. Continue current meds.    Lab Orders  No laboratory test(s) ordered today    Meds ordered this encounter  Medications  . amitriptyline (ELAVIL) 50 MG tablet    Sig: Take 2 tablets (100 mg total) by mouth at bedtime.    Dispense:  62 tablet    Refill:  0  . DISCONTD: traMADol (ULTRAM) 50 MG tablet    Sig: Take 1 tablet (50 mg total) by mouth every 8 (eight) hours as needed for moderate pain or severe pain.    Dispense:  90 tablet    Refill:  0  . DISCONTD: traMADol (ULTRAM) 50 MG tablet    Sig: Take 1 tablet (50 mg total) by mouth every 8 (eight) hours as needed for moderate pain or severe pain.    Dispense:  90 tablet    Refill:  0  . DISCONTD: traMADol (ULTRAM) 50 MG tablet    Sig: Take 1 tablet (  50 mg total) by mouth every 8 (eight) hours as needed for moderate pain or severe pain.    Dispense:  90 tablet    Refill:  0  . traMADol (ULTRAM) 50 MG tablet    Sig: Take 1 tablet (50 mg total) by mouth every 8 (eight) hours as needed for moderate pain or severe pain.    Dispense:  90 tablet    Refill:  0    Thomes Dinning, MD, MS FAMILY MEDICINE RESIDENT - PGY1 10/24/2017 7:02 PM

## 2017-10-23 NOTE — Patient Instructions (Addendum)
It was a pleasure to see you today! Thank you for choosing [facility] for your primary care. Traci Guzman was seen for bp check up fibromyalgia, and insomnia. We will increase your amytriptaline to 100 mg. Come back to the clinic in 1 month to reassess how your are doing. Come back to clinic earliar if you are having any more issues.   Meds ordered this encounter  Medications  . amitriptyline (ELAVIL) 50 MG tablet    Sig: Take 2 tablets (100 mg total) by mouth at bedtime.    Dispense:  62 tablet    Refill:  0  . DISCONTD: traMADol (ULTRAM) 50 MG tablet    Sig: Take 1 tablet (50 mg total) by mouth every 8 (eight) hours as needed for moderate pain or severe pain.    Dispense:  90 tablet    Refill:  0  . DISCONTD: traMADol (ULTRAM) 50 MG tablet    Sig: Take 1 tablet (50 mg total) by mouth every 8 (eight) hours as needed for moderate pain or severe pain.    Dispense:  90 tablet    Refill:  0  . traMADol (ULTRAM) 50 MG tablet    Sig: Take 1 tablet (50 mg total) by mouth every 8 (eight) hours as needed for moderate pain or severe pain.    Dispense:  90 tablet    Refill:  0     Best,  Traci DinningBrad Faline Langer, MD, MS FAMILY MEDICINE RESIDENT - PGY1 10/23/2017 9:51 AM

## 2017-10-24 NOTE — Assessment & Plan Note (Signed)
htn stable on current regimen. Continue current meds.

## 2017-10-24 NOTE — Assessment & Plan Note (Signed)
Patient has been experiencing more insomnia since the lost of her animal last week. Pt GAD7 and PHQ9 are mildly positive, mainly around not sleeping. Pt on 50 mg amitriptyline and uses it for fibromyalgia, mood, and insomnia. She has tried 100 mg in the past and it helped her. Will increase to 100 mg and reassess in 1 month w/ follow up.

## 2017-10-24 NOTE — Assessment & Plan Note (Signed)
Patient current pain regimen is stable. Will refill tramadol.

## 2017-11-20 ENCOUNTER — Ambulatory Visit: Payer: Self-pay | Admitting: Family Medicine

## 2018-01-17 ENCOUNTER — Other Ambulatory Visit: Payer: Self-pay

## 2018-01-22 ENCOUNTER — Telehealth: Payer: Self-pay | Admitting: Family Medicine

## 2018-01-22 NOTE — Telephone Encounter (Signed)
Pt has called back to check status of her refill. Call back [phone] Shawna OrleansMeredith B Thomsen, RN

## 2018-01-23 NOTE — Telephone Encounter (Signed)
-----   Message from Garnette GunnerAaron B Thompson, MD sent at 01/22/2018  3:20 PM EDT ----- Pt requesting tramadol, however, I will only Rx for 3 mo at a time, and she has already eclipsed that period. Please call pt and have her make an apt to reassess pain. Thank you for your help.

## 2018-01-23 NOTE — Telephone Encounter (Signed)
Patient informed and voiced understanding.  Appt made for 01-25-18.  Jazmin Hartsell,CMA

## 2018-01-25 ENCOUNTER — Encounter: Payer: Self-pay | Admitting: Family Medicine

## 2018-01-25 ENCOUNTER — Ambulatory Visit (INDEPENDENT_AMBULATORY_CARE_PROVIDER_SITE_OTHER): Payer: Self-pay | Admitting: Family Medicine

## 2018-01-25 ENCOUNTER — Other Ambulatory Visit: Payer: Self-pay

## 2018-01-25 VITALS — BP 148/94 | HR 103 | Temp 99.2°F | Ht 65.5 in | Wt 188.4 lb

## 2018-01-25 DIAGNOSIS — I10 Essential (primary) hypertension: Secondary | ICD-10-CM

## 2018-01-25 DIAGNOSIS — M797 Fibromyalgia: Secondary | ICD-10-CM

## 2018-01-25 DIAGNOSIS — F5102 Adjustment insomnia: Secondary | ICD-10-CM

## 2018-01-25 MED ORDER — AMITRIPTYLINE HCL 50 MG PO TABS: 50.0000 mg | ORAL_TABLET | Freq: Every day | ORAL | 0 refills | Status: DC

## 2018-01-25 MED ORDER — TRAMADOL HCL 50 MG PO TABS: 50.0000 mg | ORAL_TABLET | Freq: Three times a day (TID) | ORAL | 2 refills | Status: DC | PRN

## 2018-01-25 MED ORDER — TROLAMINE SALICYLATE 10 % EX CREA
1.0000 | TOPICAL_CREAM | CUTANEOUS | 0 refills | Status: DC | PRN
Start: 2018-01-25 — End: 2020-11-09

## 2018-01-25 NOTE — Assessment & Plan Note (Signed)
Continue tramadol.  Will add Aspercreme as needed for topical relief.  Consider Pristiq versus Effexor for additional pain relief.

## 2018-01-25 NOTE — Assessment & Plan Note (Signed)
Patient currently on lisinopril 20 mg.  Blood pressure is elevated today.  Likely due to pain medication not being taken today.  Prior BPs have been normal.  Will follow up with patient at next visit to obtain recertification labs included lipid profile BMP.

## 2018-01-25 NOTE — Patient Instructions (Addendum)
It was a pleasure to see you today! Thank you for choosing [facility] for your primary care. Traci Guzman was seen for chronic pain medication refill. We refilled your tramadol today. Come back to the clinic in 3 months for re-evaluation.   Best,  Thomes Dinning, MD, MS FAMILY MEDICINE RESIDENT - PGY1 01/25/2018 3:31 PM

## 2018-01-25 NOTE — Progress Notes (Signed)
Subjective:  Traci Guzman is a 52 y.o. female who [facility] for follow-up of back pain.  HPI:   Fibromyalgia Chronic back pain stable.  Patient is not worsening on current pain regimen of tramadol 50 mg 3 times daily.  Patient reports taking medication as prescribed.  Patient did not have medication today she had run out a few days ago.  She does report that her pain is worse during that time period.  Patient is in agreement to do annual UDS and signed pain contract.  Denies any adverse effects from medication causing sleepy or have difficulty breathing.  PEG questionnaire Scale of 1-10 1. What number best describes your pain on average in the past week? 5 2. What number best describes how, during the past week, pain has interfered with your enjoyment of life? 7 3. What number best describes how, during the past week, pain has interfered with your general activity? 6  Hypertension She has not been hypertensive at her previous visits.  Reports mild palpitations, slightly worse than normal.  Denies any chest pain, shortness of breath,   HYPERTENSION  Disease Monitoring: Last lipid and BMP, > 3 years. Will need repeat. Pt does not want lab testing today. Will discuss at next visit.  Blood pressure range-elevate today, past have been well controlled. Possibly due to pain as she did not have her tramadol today Chest pain- No      Dyspnea- No Medications: lisinopril 20 mg Compliance- yest Lightheadedness- no   Edema- no   ROS See HPI above   PMH Smoking Status noted   Objective:  Physical Exam: BP (!) 148/94   Pulse (!) 103   Temp 99.2 F (37.3 C) (Oral)   Ht 5' 5.5" (1.664 m)   Wt 188 lb 6.4 oz (85.5 kg)   LMP 05/19/2014   SpO2 99%   BMI 30.87 kg/m   Gen: NAD, resting comfortably CV: RRR with no murmurs appreciated Pulm: NWOB, CTAB with no crackles, wheezes, or rhonchi GI: Normal bowel sounds present. Soft, Nontender, Nondistended. MSK: lower back  and hips nontender to palpation, pt able to ambulate normally w/o assistance, no edema, cyanosis, or clubbing noted Skin: warm, dry Neuro: grossly normal, moves all extremities Psych: Normal affect and thought content  No results found for this or any previous visit (from the past 72 hour(s)).   Assessment/Plan:  Fibromyalgia Continue tramadol.  Will add Aspercreme as needed for topical relief.  Consider Pristiq versus Effexor for additional pain relief.  HTN (hypertension) Patient currently on lisinopril 20 mg.  Blood pressure is elevated today.  Likely due to pain medication not being taken today.  Prior BPs have been normal.  Will follow up with patient at next visit to obtain recertification labs included lipid profile BMP.    Lab Orders     ToxASSURE Select 13 (MW), Urine  Meds ordered this encounter  Medications  . amitriptyline (ELAVIL) 50 MG tablet    Sig: Take 1 tablet (50 mg total) by mouth at bedtime.    Dispense:  31 tablet    Refill:  0  . traMADol (ULTRAM) 50 MG tablet    Sig: Take 1 tablet (50 mg total) by mouth every 8 (eight) hours as needed.    Dispense:  30 tablet    Refill:  2    Space refills by 30 days  . trolamine salicylate (ASPERCREME/ALOE) 10 % cream    Sig: Apply 1 application topically as  needed for muscle pain.    Dispense:  85 g    Refill:  0    Thomes Dinning, MD, MS FAMILY MEDICINE RESIDENT - PGY1 01/25/2018 4:23 PM

## 2018-01-31 LAB — TOXASSURE SELECT 13 (MW), URINE

## 2018-02-15 ENCOUNTER — Telehealth: Payer: Self-pay | Admitting: Family Medicine

## 2018-02-15 NOTE — Telephone Encounter (Signed)
Called pt, did not get left VM to return call to office. Pt recently started on tramadol for back pain. UDS was sent per starting therapy for long term chronic pain. UDS returned positive for lorazepam. Pt is not being prescribed this from our clinic and not on current medlist. Not seen in [place] Controlled Substance Database per last review before prescribing tramadol. Given the contraindication of opioids and benzodiazepines, will discontinue prescribing tramadol. Will discuss further with patient when she returns call.

## 2018-02-15 NOTE — Telephone Encounter (Signed)
Called patient back regarding UDS positive for both lorazepam and tramadol. Pt says that she occasionally will take a lorazepam from her husband or son when she fills stressed. Discussed the danger of mixing tramadol and benzodiazepine with patient. She acknowledged this over the phone. However, given that patient was using unprescribed lorazepam. I will not continue to prescribe tramadol for her and will cancel current Rx.

## 2018-02-23 ENCOUNTER — Other Ambulatory Visit: Payer: Self-pay | Admitting: Family Medicine

## 2018-03-13 ENCOUNTER — Other Ambulatory Visit: Payer: Self-pay | Admitting: Family Medicine

## 2018-03-13 DIAGNOSIS — F5102 Adjustment insomnia: Secondary | ICD-10-CM

## 2018-03-15 MED ORDER — LISINOPRIL 20 MG PO TABS: 20.0000 mg | ORAL_TABLET | Freq: Every day | ORAL | 3 refills | Status: DC

## 2018-03-15 MED ORDER — AMITRIPTYLINE HCL 50 MG PO TABS: 50.0000 mg | ORAL_TABLET | Freq: Every day | ORAL | 0 refills | Status: DC

## 2018-03-15 NOTE — Addendum Note (Signed)
Addended by: Henri Medal on: 03/15/2018 09:50 AM   Modules accepted: Orders

## 2018-03-16 MED ORDER — AMITRIPTYLINE HCL 50 MG PO TABS: 50.0000 mg | ORAL_TABLET | Freq: Every day | ORAL | 0 refills | Status: DC

## 2018-03-16 NOTE — Addendum Note (Signed)
Addended by: Blair PromiseHOMSEN, Ta Fair on: 03/16/2018 03:27 PM   Modules accepted: Orders

## 2018-03-16 NOTE — Telephone Encounter (Signed)
Pt called nurse line, states she is still waiting on her amitriptyline, and that it was refused. It looks like rx had been sent to Dr. Jimmey Ralph. Was refilled yesterday but not received by [facility] by pharmacy. Can you send in her amitriptyline? I will let her know that it was not denied by you but by Dr. Jimmey Ralph. Thanks! Shawna Orleans, RN

## 2018-03-16 NOTE — Addendum Note (Signed)
Addended by: Henri Medal on: 03/16/2018 03:32 PM   Modules accepted: Orders

## 2018-05-03 ENCOUNTER — Telehealth: Payer: Self-pay | Admitting: Family Medicine

## 2018-05-03 NOTE — Telephone Encounter (Signed)
LVM to schedule med refill appt. Please assist with do this.

## 2018-05-18 ENCOUNTER — Other Ambulatory Visit: Payer: Self-pay | Admitting: Family Medicine

## 2018-05-18 DIAGNOSIS — F5102 Adjustment insomnia: Secondary | ICD-10-CM

## 2018-06-20 ENCOUNTER — Other Ambulatory Visit: Payer: Self-pay

## 2018-06-20 ENCOUNTER — Encounter: Payer: Self-pay | Admitting: Family Medicine

## 2018-06-20 ENCOUNTER — Ambulatory Visit (INDEPENDENT_AMBULATORY_CARE_PROVIDER_SITE_OTHER): Payer: Self-pay | Admitting: Family Medicine

## 2018-06-20 VITALS — BP 124/72 | HR 84 | Temp 98.5°F | Ht 66.0 in | Wt 182.0 lb

## 2018-06-20 DIAGNOSIS — I10 Essential (primary) hypertension: Secondary | ICD-10-CM

## 2018-06-20 DIAGNOSIS — F5101 Primary insomnia: Secondary | ICD-10-CM

## 2018-06-20 MED ORDER — AMITRIPTYLINE HCL 50 MG PO TABS: 50.0000 mg | ORAL_TABLET | Freq: Every day | ORAL | 11 refills | Status: DC

## 2018-06-20 NOTE — Assessment & Plan Note (Signed)
Patient is taking 50 mg of amitriptyline.  This is helping her with her sleep.  Also helps with fibromyalgia.  Continue current dose.

## 2018-06-20 NOTE — Assessment & Plan Note (Signed)
Currently well controlled on lisinopril.  Will get routine BMP and lipid for risk stratification.  Continue current medication and return in 6 months for BP check.  Will DC aspirin.  Shared discussion making used to come to this outcome.

## 2018-06-20 NOTE — Patient Instructions (Signed)

## 2018-06-20 NOTE — Progress Notes (Signed)
    Subjective:  Traci Guzman is a 52 y.o. female who presents to the [facility] today .   HPI: Patient here today for blood pressure check.  Also on medication refill.    For hypertension, patient's blood pressures well controlled today.  Denies any chest pain, shortness of breath, lower extremity swelling.  She is currently taking a daily aspirin ASA 81 mg.  She was placed on this for "arm going numb".  She is no history of blood clot in that arm.  There is no imaging to support this that I can find in the record.  She has not had a heart attack or stroke.  Discussed risk and benefits of being on aspirin, including unclear evidence of prevention of cardiac disease and a well-controlled hypertensive at her age (ASCVD ~2.5%).   Fibromyalgia/insomnia Patient endorses some pain.  But this is controlled on amitriptyline.  She also takes this for insomnia.  Says that she is able to sleep every night on her current dosage of 50 mg daily.  Denies pain worsening.  ROS: Per HPI  PMH: Smoking history reviewed.    Objective:  Physical Exam: BP 124/72   Pulse 84   Temp 98.5 F (36.9 C) (Oral)   Ht 5\' 6"  (1.676 m)   Wt 182 lb (82.6 kg)   LMP 05/19/2014   SpO2 99%   BMI 29.38 kg/m   Gen: NAD, resting comfortably CV: RRR with no murmurs appreciated Pulm: NWOB, CTAB with no crackles, wheezes, or rhonchi GI: Normal bowel sounds present. Soft, Nontender, Nondistended. MSK: no edema, cyanosis, or clubbing noted Skin: warm, dry  No results found for this or any previous visit (from the past 72 hour(s)).   Assessment/Plan:  HTN (hypertension) Currently well controlled on lisinopril.  Will get routine BMP and lipid for risk stratification.  Continue current medication and return in 6 months for BP check.  Will DC aspirin.  Shared discussion making used to come to this outcome.  Adjustment insomnia Patient is taking 50 mg of amitriptyline.  This is helping her with her sleep.  Also helps with  fibromyalgia.  Continue current dose.    Lab Orders     Basic Metabolic Panel     Lipid Panel  Meds ordered this encounter  Medications  . amitriptyline (ELAVIL) 50 MG tablet    Sig: Take 1 tablet (50 mg total) by mouth at bedtime.    Dispense:  31 tablet    Refill:  11    Please consider 90 day supplies to promote better adherence    Thomes Dinning, MD, MS FAMILY MEDICINE RESIDENT - PGY2 06/20/2018 9:45 AM

## 2018-06-21 ENCOUNTER — Telehealth: Payer: Self-pay

## 2018-06-21 ENCOUNTER — Other Ambulatory Visit: Payer: Self-pay | Admitting: Family Medicine

## 2018-06-21 DIAGNOSIS — E7800 Pure hypercholesterolemia, unspecified: Secondary | ICD-10-CM

## 2018-06-21 LAB — LIPID PANEL
CHOL/HDL RATIO: 4.9 ratio — AB (ref 0.0–4.4)
Cholesterol, Total: 219 mg/dL — ABNORMAL HIGH (ref 100–199)
HDL: 45 mg/dL (ref >39)
LDL Calculated: 150 mg/dL — ABNORMAL HIGH (ref 0–99)
Triglycerides: 118 mg/dL (ref 0–149)
VLDL Cholesterol Cal: 24 mg/dL (ref 5–40)

## 2018-06-21 LAB — BASIC METABOLIC PANEL
BUN / CREAT RATIO: 19 (ref 9–23)
BUN: 15 mg/dL (ref 6–24)
CO2: 25 mmol/L (ref 20–29)
Calcium: 10.3 mg/dL — ABNORMAL HIGH (ref 8.7–10.2)
Chloride: 105 mmol/L (ref 96–106)
Creatinine, Ser: 0.79 mg/dL (ref 0.57–1.00)
GFR, EST AFRICAN AMERICAN: 100 mL/min/{1.73_m2} (ref >59)
GFR, EST NON AFRICAN AMERICAN: 86 mL/min/{1.73_m2} (ref >59)
GLUCOSE: 95 mg/dL (ref 65–99)
Potassium: 5.1 mmol/L (ref 3.5–5.2)
SODIUM: 145 mmol/L — AB (ref 134–144)

## 2018-06-21 MED ORDER — ATORVASTATIN CALCIUM 40 MG PO TABS: 40.0000 mg | ORAL_TABLET | Freq: Every day | ORAL | 11 refills | Status: DC

## 2018-06-21 NOTE — Telephone Encounter (Signed)
-----   Message from Garnette Gunner, MD sent at 06/21/2018  8:24 AM EDT ----- Please call patient and inform her that her cholesterol level is elevated to the point that it is recommended to start a statin medication. I have placed an order for her to start atorvastatin 40 mg. Her other chemistries were normal.

## 2018-06-21 NOTE — Telephone Encounter (Signed)
Pt contacted and informed of elevated cholesterol and the need to start a statin. All questions answered and directions given on medication.

## 2018-06-21 NOTE — Progress Notes (Signed)
Please call patient and inform her that her cholesterol level is elevated to the point that it is recommended to start a statin medication. I have placed an order for her to start atorvastatin 40 mg. Her other chemistries were normal.

## 2018-11-26 ENCOUNTER — Encounter: Payer: Self-pay | Admitting: Family Medicine

## 2018-11-26 ENCOUNTER — Other Ambulatory Visit: Payer: Self-pay

## 2018-11-26 ENCOUNTER — Ambulatory Visit (INDEPENDENT_AMBULATORY_CARE_PROVIDER_SITE_OTHER): Payer: Self-pay | Admitting: Family Medicine

## 2018-11-26 VITALS — BP 130/80 | HR 99 | Temp 98.7°F | Wt 185.4 lb

## 2018-11-26 DIAGNOSIS — F321 Major depressive disorder, single episode, moderate: Secondary | ICD-10-CM

## 2018-11-26 DIAGNOSIS — F5101 Primary insomnia: Secondary | ICD-10-CM

## 2018-11-26 MED ORDER — AMITRIPTYLINE HCL 75 MG PO TABS: 75.0000 mg | ORAL_TABLET | Freq: Every day | ORAL | 0 refills | Status: DC

## 2018-11-26 NOTE — Progress Notes (Addendum)
Established Patient Office Visit  Subjective:  Patient ID: Traci FrockShannon R Gantt, female    DOB: 1966-04-14  Age: 53 y.o. MRN: [id number]  CC:  Chief Complaint  Patient presents with  . poss. depression    HPI Traci Guzman presents for   Presenting Issue: Mood  Report of symptoms: crying, not getting out of bed, lack of ambition, trouble sleeping,   Duration of CURRENT symptoms:1 year Age of onset of first mood disturbance:never  Impact on function: unable to do anything  Psychiatric History - Diagnoses: no - Hospitalizations:  no - Pharmacotherapy: amitriptyline (for insomnia) - Outpatient therapy: can't afford  Family history of psychiatric issues:none  Current and history of substance use:  Cigarrettes - 1 pack/day, 36 years  Other: Associated/worse with husbands new dx of COPD, husband is bipolar, hard to live wife, no abuse or trauma  PHQ-9:15, neg for suicide MDQ: GAD7:12  Discussed potential interaction between SSRIs and Elavil and how we might have to discontinue that. Using elavil at higher than 50mg  makes pt to groggy.  Patient does not wish to did not continue Elavil.  Traci Guzman is willing to try it at a higher dose again if it helps her with her mood.  Past Medical History:  Diagnosis Date  . Arthritis    primarily knees  . Depression 06/24/2011  . Dyshidrotic foot dermatitis   . Dyshidrotic foot dermatitis   . 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 Fibromyalgia   . Healthcare maintenance    pap 2010, mammogram 2004  . History of anemia 08/31/2011  . HTN (hypertension) 06/23/2011  . Hypertension   . Restless leg syndrome   . Urticarial rash, chronic since 6/12 08/10/2011    Past Surgical History:  Procedure Laterality Date  . CESAREAN SECTION     2x-1985 and 1988  . CHOLECYSTECTOMY     1995  . TUBAL LIGATION     1988    Family History  Problem Relation Age of Onset  . Diabetes Mother        age 53    Social History    Socioeconomic History  . Marital status: Married    Spouse name: Not on file  . Number of children: Not on file  . Years of education: Not on file  . Highest education level: Not on file  Occupational History  . Not on file  Social Needs  . Financial resource strain: Not on file  . Food insecurity:    Worry: Not on file    Inability: Not on file  . Transportation needs:    Medical: Not on file    Non-medical: Not on file  Tobacco Use  . Smoking status: Current Every Day Smoker    Packs/day: 1.00    Years: 26.00    Pack years: 26.00    Types: Cigarettes    Last attempt to quit: 06/06/2011    Years since quitting: 7.4  . Smokeless tobacco: Never Used  . Tobacco comment: Husband smokes around her  Substance and Sexual Activity  . Alcohol use: No    Alcohol/week: 1.0 standard drinks    Types: 1 Glasses of wine per week  . Drug use: No  . Sexual activity: Not on file  Lifestyle  . Physical activity:    Days per week: Not on file    Minutes per session: Not on file  . Stress: Not on file  Relationships  . Social connections:    Talks on phone:  Not on file    Gets together: Not on file    Attends religious service: Not on file    Active member of club or organization: Not on file    Attends meetings of clubs or organizations: Not on file    Relationship status: Not on file  . Intimate partner violence:    Fear of current or ex partner: Not on file    Emotionally abused: Not on file    Physically abused: Not on file    Forced sexual activity: Not on file  Other Topics Concern  . Not on file  Social History Narrative   Lives with husband Yarielys Denoncourt (married 29 years) and son. Has 3 dogs and fish. Not employed. Says does not do much for fun. HS graduate.       Quit smoking in 2008. 26 pack years. No recreational drug use. Drinks 1 glass of wine per month. Does not exercise.     Outpatient Medications Prior to Visit  Medication Sig Dispense Refill  .  atorvastatin (LIPITOR) 40 MG tablet Take 1 tablet (40 mg total) by mouth daily. 30 tablet 11  . cetirizine (ZYRTEC) 10 MG tablet Take 10 mg by mouth at bedtime.    Marland Kitchen lisinopril (PRINIVIL,ZESTRIL) 20 MG tablet Take 1 tablet (20 mg total) by mouth daily. 90 tablet 3  . Multiple Vitamin (MULTIVITAMIN) tablet Take 1 tablet by mouth at bedtime.     . trolamine salicylate (ASPERCREME/ALOE) 10 % cream Apply 1 application topically as needed for muscle pain. 85 g 0  . amitriptyline (ELAVIL) 50 MG tablet Take 1 tablet (50 mg total) by mouth at bedtime. 31 tablet 11   No facility-administered medications prior to visit.     Allergies  Allergen Reactions  . Dairy Aid [Lactase]   . Gluten Meal Other (See Comments)    Swelling of feet and hands.    . Milk-Related Compounds     ROS Review of Systems  Respiratory: Negative for cough and shortness of breath.   Psychiatric/Behavioral: Positive for behavioral problems and decreased concentration. Negative for suicidal ideas.  All other systems reviewed and are negative.     Objective:    Physical Exam  Constitutional: Traci Guzman appears well-developed and well-nourished. No distress.  HENT:  Head: Normocephalic and atraumatic.  Eyes: No scleral icterus.  Neck: No JVD present.  Cardiovascular: Normal rate and regular rhythm. Exam reveals no friction rub.  Pulmonary/Chest: Effort normal and breath sounds normal.  Abdominal: Soft. Traci Guzman exhibits no distension.  Musculoskeletal: Normal range of motion.        General: No edema.  Neurological: Traci Guzman is alert.  Skin: Skin is warm and dry.    BP 130/80   Pulse 99   Temp 98.7 F (37.1 C) (Oral)   Wt 185 lb 6 oz (84.1 kg)   LMP 05/19/2014   SpO2 97%   BMI 29.92 kg/m  Wt Readings from Last 3 Encounters:  11/26/18 185 lb 6 oz (84.1 kg)  06/20/18 182 lb (82.6 kg)  01/25/18 188 lb 6.4 oz (85.5 kg)     Health Maintenance Due  Topic Date Due  . HIV Screening  12/31/1980  . PAP SMEAR-Modifier   08/31/2012  . MAMMOGRAM  01/01/2016  . COLONOSCOPY  01/01/2016    There are no preventive care reminders to display for this patient.  Lab Results  Component Value Date   TSH 0.382 03/31/2014   Lab Results  Component Value Date   WBC 11.0 (  H) 03/24/2014   HGB 11.6 (L) 03/24/2014   HCT 34.9 (L) 03/24/2014   MCV 89.3 03/24/2014   PLT 379 03/24/2014   Lab Results  Component Value Date   NA 145 (H) 06/20/2018   K 5.1 06/20/2018   CO2 25 06/20/2018   GLUCOSE 95 06/20/2018   BUN 15 06/20/2018   CREATININE 0.79 06/20/2018   CALCIUM 10.3 (H) 06/20/2018   Lab Results  Component Value Date   CHOL 219 (H) 06/20/2018   Lab Results  Component Value Date   HDL 45 06/20/2018   Lab Results  Component Value Date   LDLCALC 150 (H) 06/20/2018   Lab Results  Component Value Date   TRIG 118 06/20/2018   Lab Results  Component Value Date   CHOLHDL 4.9 (H) 06/20/2018   Lab Results  Component Value Date   HGBA1C 5.7 03/31/2014      Assessment & Plan:   Problem List Items Addressed This Visit      Other   Depression - Primary    Patient denies any diagnosis of depression or anxiety, however this is demonstrated multiple times in her chart.  Patient scored positive for gad 7 and PHQ 9.  Patient already on tricyclic for insomnia.  Traci Guzman is willing to try this at a higher dose to see if it improves mood.  This is made her groggy in the past so we will trial slow upward titration.  Increase Elavil to 75 mg.  Patient follow-up with mood in 4 weeks.      Relevant Medications   amitriptyline (ELAVIL) 75 MG tablet    Other Visit Diagnoses    Primary insomnia       Relevant Medications   amitriptyline (ELAVIL) 75 MG tablet      Meds ordered this encounter  Medications  . amitriptyline (ELAVIL) 75 MG tablet    Sig: Take 1 tablet (75 mg total) by mouth at bedtime for 30 days.    Dispense:  30 tablet    Refill:  0    Please consider 90 day supplies to promote better adherence     Follow-up: Return in about 4 weeks (around 12/24/2018) for depression, PHQ9/GAD7.    Garnette Gunner, MD

## 2018-11-26 NOTE — Assessment & Plan Note (Signed)
Patient denies any diagnosis of depression or anxiety, however this is demonstrated multiple times in her chart.  Patient scored positive for gad 7 and PHQ 9.  Patient already on tricyclic for insomnia.  She is willing to try this at a higher dose to see if it improves mood.  This is made her groggy in the past so we will trial slow upward titration.  Increase Elavil to 75 mg.  Patient follow-up with mood in 4 weeks.

## 2018-11-26 NOTE — Patient Instructions (Signed)

## 2018-12-28 ENCOUNTER — Ambulatory Visit: Payer: Self-pay | Admitting: Family Medicine

## 2019-03-29 ENCOUNTER — Other Ambulatory Visit: Payer: Self-pay | Admitting: Family Medicine

## 2019-03-29 DIAGNOSIS — F5101 Primary insomnia: Secondary | ICD-10-CM

## 2019-03-29 DIAGNOSIS — F321 Major depressive disorder, single episode, moderate: Secondary | ICD-10-CM

## 2019-05-13 ENCOUNTER — Other Ambulatory Visit: Payer: Self-pay | Admitting: Family Medicine

## 2019-05-13 NOTE — Telephone Encounter (Signed)
appt made for 05-22-2019.  Jazmin Hartsell,CMA

## 2019-05-22 ENCOUNTER — Other Ambulatory Visit: Payer: Self-pay

## 2019-05-22 ENCOUNTER — Ambulatory Visit (INDEPENDENT_AMBULATORY_CARE_PROVIDER_SITE_OTHER): Payer: Self-pay | Admitting: Family Medicine

## 2019-05-22 ENCOUNTER — Other Ambulatory Visit: Payer: Self-pay | Admitting: Family Medicine

## 2019-05-22 ENCOUNTER — Ambulatory Visit: Payer: Self-pay | Admitting: Family Medicine

## 2019-05-22 VITALS — BP 126/80 | HR 87

## 2019-05-22 DIAGNOSIS — Z1231 Encounter for screening mammogram for malignant neoplasm of breast: Secondary | ICD-10-CM

## 2019-05-22 DIAGNOSIS — R0781 Pleurodynia: Secondary | ICD-10-CM

## 2019-05-22 DIAGNOSIS — E7849 Other hyperlipidemia: Secondary | ICD-10-CM

## 2019-05-22 DIAGNOSIS — Z0000 Encounter for general adult medical examination without abnormal findings: Secondary | ICD-10-CM

## 2019-05-22 DIAGNOSIS — E785 Hyperlipidemia, unspecified: Secondary | ICD-10-CM | POA: Insufficient documentation

## 2019-05-22 DIAGNOSIS — N644 Mastodynia: Secondary | ICD-10-CM

## 2019-05-22 DIAGNOSIS — I10 Essential (primary) hypertension: Secondary | ICD-10-CM

## 2019-05-22 DIAGNOSIS — Z720 Tobacco use: Secondary | ICD-10-CM

## 2019-05-22 HISTORY — DX: Pleurodynia: R07.81

## 2019-05-22 NOTE — Assessment & Plan Note (Signed)
Normotensive today.  Patient reports that she remains normotensive at home as well.  Attempting to work on diet and exercise but is difficult with her sick husband and also trying to quit smoking at the same time.  Encourage healthy diet and daily exercise as tolerated.  Continue current regimen of lisinopril 20 mg daily.  Recommend BMP, patient would like to wait until she can get a fasting lipid panel as well.  Follow-up in 3 months with PCP.  Strict return precautions given.

## 2019-05-22 NOTE — Progress Notes (Signed)
Subjective:    Patient ID: Traci Guzman, female    DOB: 11-07-1965, 53 y.o.   MRN: [id number]   CC: HTN and breast pain   HPI: Hypertension: - Medications: lisinopril 20mg  - Compliance: good  - Checking BP at home: yes, normotensive  - Denies any SOB, CP, vision changes, LE edema, medication SEs, or symptoms of hypotension - Diet: excessive, lots of salt and red meat, was eating well and exercising until having to care for her husband. Gained 20 lbs.   - Exercise: none  Hyperlipidemia Meds: atorvaastatin 40mg   Diet: see above  Exercise: see above   Tobacco use Listed in history as currently every day smoker with 1PPD x26years (26 pack history). Quit smoking May 6th.   Breast pain Patient reports bilateral "breast pain". On further inquiry it is mainly rib pain. Started on her left side, has a h/o gallbladder removal and thought it was pain from her surgical port site. For past two months it has progressively worsened and now is bilateral. States that pain is worsened with bending forward or to the side. Better when sitting up. Does report rice "sticks" when she is eating. And takes a while to work down. Denies rashes, but husband currently has shingles.   Objective:  BP 126/80   Pulse 87   LMP 05/19/2014   SpO2 97%  Vitals and nursing note reviewed  General: well nourished, in no acute distress HEENT: normocephalic, no scleral icterus or conjunctival pallor Cardiac: RRR, clear S1 and S2, no murmurs, rubs, or gallops Respiratory: clear to auscultation bilaterally, no increased work of breathing Abdomen: soft, tenderness to palpation of costal margin bilaterally, nondistended, no masses or organomegaly. Bowel sounds present Extremities: no edema or cyanosis. Warm, well perfused. 2+ radial and PT pulses bilaterally Skin: warm and dry, no rashes noted Neuro: alert and oriented, no focal deficits Breasts: breasts appear normal, no suspicious masses, no skin or nipple  changes or axillary nodes. Some tenderness to palpation of left breast.  Assessment & Plan:    HTN (hypertension) Normotensive today.  Patient reports that she remains normotensive at home as well.  Attempting to work on diet and exercise but is difficult with her sick husband and also trying to quit smoking at the same time.  Encourage healthy diet and daily exercise as tolerated.  Continue current regimen of lisinopril 20 mg daily.  Recommend BMP, patient would like to wait until she can get a fasting lipid panel as well.  Follow-up in 3 months with PCP.  Strict return precautions given.  Hyperlipidemia Patient on atorvastatin 40 mg daily.  We will continue this medication regimen. Per patient request will wait to get fasting lipid panel at next visit for annual wellness exam  Tobacco abuse Quit date May 6th. Doing well with cessation. Congratulated patient on quitting smoking.  Offered behavioral health resources as well as 1 800 quit line if needed.  Costal margin pain Likely musculoskeletal in origin especially given positional change pain.  Advised to use capsaicin cream as needed.  Discussed possible acid reflux, patient states she will use over-the-counter H2 blocker.  Strict return precautions given.  Follow-up in 1 month.  Patient is due for mammogram so advised to have this done.  Scheduled for September.  Healthcare maintenance Due for Pap smear as well as colonoscopy.  Advised that patient schedule annual wellness visit with her PCP as soon as possible.    Return in about 2 weeks (around 06/05/2019) for annuall wellness  visit and PAP smear .   Caroline More, DO, PGY-3

## 2019-05-22 NOTE — Assessment & Plan Note (Addendum)
Patient on atorvastatin 40 mg daily.  We will continue this medication regimen. Per patient request will wait to get fasting lipid panel at next visit for annual wellness exam

## 2019-05-22 NOTE — Assessment & Plan Note (Signed)
Due for Pap smear as well as colonoscopy.  Advised that patient schedule annual wellness visit with her PCP as soon as possible.

## 2019-05-22 NOTE — Assessment & Plan Note (Signed)
Likely musculoskeletal in origin especially given positional change pain.  Advised to use capsaicin cream as needed.  Discussed possible acid reflux, patient states she will use over-the-counter H2 blocker.  Strict return precautions given.  Follow-up in 1 month.  Patient is due for mammogram so advised to have this done.  Scheduled for September.

## 2019-05-22 NOTE — Patient Instructions (Addendum)
You are due for you annual wellness exam including your pap smear. I recommend you make an appointment with your primary doctor (Dr. Grandville Silos) ASAP to have this done. You are also due for a colonoscopy.   For your rib pain: I think this has to do with muscle strain. I would recommend capsaicin cream (can find this over the counter). There could be a component of acid reflux. If you would like, we can try a medicine to help with reflux. If the pain is not improved please follow up in 1 month. Sooner if it worsens.   Please go to the emergency room for chest pain or shortness of breath.   Dr. Tammi Klippel

## 2019-05-22 NOTE — Assessment & Plan Note (Signed)
Quit date May 6th. Doing well with cessation. Congratulated patient on quitting smoking.  Offered behavioral health resources as well as 1 800 quit line if needed.

## 2019-06-27 ENCOUNTER — Other Ambulatory Visit: Payer: Self-pay | Admitting: Family Medicine

## 2019-06-27 DIAGNOSIS — E7800 Pure hypercholesterolemia, unspecified: Secondary | ICD-10-CM

## 2019-06-27 DIAGNOSIS — F5101 Primary insomnia: Secondary | ICD-10-CM

## 2019-06-27 DIAGNOSIS — F321 Major depressive disorder, single episode, moderate: Secondary | ICD-10-CM

## 2019-07-08 ENCOUNTER — Ambulatory Visit
Admission: RE | Admit: 2019-07-08 | Discharge: 2019-07-08 | Disposition: A | Discharge status: Home / Self Care | Payer: Self-pay | Source: Ambulatory Visit | Attending: Family Medicine | Admitting: Family Medicine

## 2019-07-08 ENCOUNTER — Other Ambulatory Visit: Payer: Self-pay

## 2019-07-08 ENCOUNTER — Other Ambulatory Visit: Payer: Self-pay | Admitting: Family Medicine

## 2019-07-08 DIAGNOSIS — Z1231 Encounter for screening mammogram for malignant neoplasm of breast: Secondary | ICD-10-CM

## 2019-07-08 DIAGNOSIS — N644 Mastodynia: Secondary | ICD-10-CM

## 2019-08-02 ENCOUNTER — Other Ambulatory Visit: Payer: Self-pay | Admitting: Family Medicine

## 2019-08-02 DIAGNOSIS — F5101 Primary insomnia: Secondary | ICD-10-CM

## 2019-08-02 DIAGNOSIS — E7800 Pure hypercholesterolemia, unspecified: Secondary | ICD-10-CM

## 2019-08-02 DIAGNOSIS — F321 Major depressive disorder, single episode, moderate: Secondary | ICD-10-CM

## 2019-09-19 ENCOUNTER — Other Ambulatory Visit: Payer: Self-pay | Admitting: Family Medicine

## 2019-09-19 DIAGNOSIS — F5101 Primary insomnia: Secondary | ICD-10-CM

## 2019-09-19 DIAGNOSIS — E7800 Pure hypercholesterolemia, unspecified: Secondary | ICD-10-CM

## 2019-09-19 DIAGNOSIS — F321 Major depressive disorder, single episode, moderate: Secondary | ICD-10-CM

## 2019-10-27 ENCOUNTER — Other Ambulatory Visit: Payer: Self-pay | Admitting: Family Medicine

## 2019-10-27 DIAGNOSIS — F5101 Primary insomnia: Secondary | ICD-10-CM

## 2019-10-27 DIAGNOSIS — E7800 Pure hypercholesterolemia, unspecified: Secondary | ICD-10-CM

## 2019-10-27 DIAGNOSIS — F321 Major depressive disorder, single episode, moderate: Secondary | ICD-10-CM

## 2019-11-08 ENCOUNTER — Encounter: Payer: Self-pay | Admitting: Family Medicine

## 2019-11-08 ENCOUNTER — Other Ambulatory Visit: Payer: Self-pay

## 2019-11-08 ENCOUNTER — Ambulatory Visit (INDEPENDENT_AMBULATORY_CARE_PROVIDER_SITE_OTHER): Payer: Self-pay | Admitting: Family Medicine

## 2019-11-08 VITALS — BP 122/72 | HR 92 | Wt 219.8 lb

## 2019-11-08 DIAGNOSIS — G8929 Other chronic pain: Secondary | ICD-10-CM

## 2019-11-08 DIAGNOSIS — M7541 Impingement syndrome of right shoulder: Secondary | ICD-10-CM

## 2019-11-08 DIAGNOSIS — M25511 Pain in right shoulder: Secondary | ICD-10-CM

## 2019-11-08 MED ORDER — METHYLPREDNISOLONE ACETATE 40 MG/ML IJ SUSP
40.0000 mg | Freq: Once | INTRAMUSCULAR | Status: AC
  Administered 2019-11-08: 11:00:00 40 mg via INTRAMUSCULAR

## 2019-11-08 NOTE — Patient Instructions (Signed)
Ms Griffin,  It was lovely to meet you today. We gave you a steroid injection in your right shoulder joint for impingement syndrome, this is where the soft tissue in your shoulder gets impinged when you moved your arm.   Please come and see [place] in 2 weeks for a follow up. Take 600mg  ibuprofen later today and this should help with any pain you may experience from procedure.  Please do not hesitate to contact our clinic if you have any further questions.  Best wishes,  Dr 

## 2019-11-08 NOTE — Progress Notes (Cosign Needed)
   Subjective:    Patient ID: Traci Guzman, female    DOB: 06/10/1966, 54 y.o.   MRN: [id number]   CC: Traci Guzman is a 54 yr old female who presents today for right sided shoulder pain  HPI:  Right shoulder pain  Pt has chronic hx of right sided shoulder pain which has gradually worsened over last 4 months. Initially thought it was her fibromyalgia pain which she feels between the shoulder blades. Described as aching, throbbing pain radiating to the right arm and wrist sometimes. 4/10 severity now but can be 9/10 at its worst. Exacerbated by reaching high for objects or when she is holding things, find it is worse at night. Has tried ibuprofen and tylenol but has not helped. Heat pads have helped alleviate the pain a little. 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 No injuries to shoulder joint/trauma.  Smoking status reviewed   ROS: pertinent noted in the HPI    Past medical history, surgical, family, and social history reviewed and updated in the EMR as appropriate. Reviewed problem list.   Objective:  BP 122/72   Pulse 92   Wt 219 lb 12.8 oz (99.7 kg)   LMP 05/19/2014   SpO2 98%   BMI 35.48 kg/m   Vitals and nursing note reviewed  General: NAD, pleasant, able to participate in exam Shoulder: no obvious deformities of glenohumeral joint bilaterally, no atrophy of muscles. Tender on palpation of right subacromial space. Positive painful arc. Positive Hawkin-Kennedy test 5/5 strength UE, normal sensation and DTR 2+  Subacromial injection, Supervised by Dr Sheffield Slider  Steroid injection in right subacromial space given. 1cc methylprednisone (40mg ) and 4cc of lidocaine. Area cleaned with betadine and cold spray applied.   Assessment & Plan:    Impingement syndrome of right shoulder Likely impingement syndrome given hx of occupation, positive positive arc test and positive Hawkin-Kennedy. Considered adhesive capsulitis but has  normal active and passive ROM. Considered fractures/dislocation however no recent hx of trauma. Treated with corticosteroid steroid injection into subacromial space. Follow up in 2 weeks. Consider further imaging ie xray, ultrasound or MRI and orthopedic referral if pain persists.   , MD  [facility] PGY-1

## 2019-11-11 DIAGNOSIS — M7541 Impingement syndrome of right shoulder: Secondary | ICD-10-CM

## 2019-11-11 HISTORY — DX: Impingement syndrome of right shoulder: M75.41

## 2019-11-11 NOTE — Assessment & Plan Note (Addendum)
Likely impingement syndrome given hx of occupation, positive positive arc test and positive Hawkin-Kennedy. Considered adhesive capsulitis but has normal active and passive ROM. Considered fractures/dislocation however no recent hx of trauma. Treated with corticosteroid steroid injection into subacromial space. Follow up in 2 weeks. Consider further imaging ie xray, ultrasound or MRI and orthopedic referral if pain persists.

## 2019-11-18 ENCOUNTER — Other Ambulatory Visit: Payer: Self-pay

## 2019-11-18 ENCOUNTER — Encounter: Payer: Self-pay | Admitting: Family Medicine

## 2019-11-18 ENCOUNTER — Ambulatory Visit (INDEPENDENT_AMBULATORY_CARE_PROVIDER_SITE_OTHER): Payer: Self-pay | Admitting: Family Medicine

## 2019-11-18 VITALS — BP 125/75 | HR 85 | Temp 98.6°F | Wt 215.0 lb

## 2019-11-18 DIAGNOSIS — Z720 Tobacco use: Secondary | ICD-10-CM

## 2019-11-18 DIAGNOSIS — M25511 Pain in right shoulder: Secondary | ICD-10-CM

## 2019-11-18 DIAGNOSIS — G8929 Other chronic pain: Secondary | ICD-10-CM

## 2019-11-18 MED ORDER — NAPROXEN 500 MG PO TABS: 500.0000 mg | ORAL_TABLET | Freq: Two times a day (BID) | ORAL | 0 refills | Status: DC | PRN

## 2019-11-18 NOTE — Assessment & Plan Note (Signed)
Chronic right shoulder pain.  Possibly shoulder impingement.  No improvement with this steroid injection.  - Recommend trial of stronger course of NSAIDs for 2 weeks.  May continue with Tylenol, heat and ice alternating -X-ray imaging to rule out bony abnormalities -Referral to sports medicine for further work-up and imaging

## 2019-11-18 NOTE — Progress Notes (Signed)
   CHIEF COMPLAINT / HPI:  Chronic right shoulder pain, worsening Patient seen in clinic on 01/22 for right shoulder pain.  Work-up on physical exam was consistent with acromial impingement.  At that date she received a steroid injection.  Reports worsening pain since then.  Is predominantly at night and she has difficulty sleeping.  Patient has been taking ibuprofen 400 mg every 4 to 6 hours with intermittent Tylenol.  She has also been applying heat which does help.  Trial of diclofenac without success as patient did not tolerate due to skin reaction.  Denies any fevers or chills.  Denies any injury to the area.  PERTINENT  PMH / PSH:  Patient with history of tobacco abuse  OBJECTIVE: BP 125/75   Pulse 85   Temp 98.6 F (37 C) (Oral)   Wt 215 lb (97.5 kg)   LMP 05/19/2014   SpO2 98%   BMI 34.70 kg/m    Gen:NAD, resting comfortably MSK: Right anterior shoulder tender to palpation, negative Spurling sign, 5 of 5 strength in upper extremities bilaterally, normal sensation intact in upper extremities bilaterally, no tenderness to palpation along the cervical spine, pain worse with biceps contracture Skin: warm, dry Neuro: grossly normal, moves all extremities Psych: Normal affect and thought content  ASSESSMENT / PLAN:  Chronic right shoulder pain Chronic right shoulder pain.  Possibly shoulder impingement.  No improvement with this steroid injection.  - Recommend trial of stronger course of NSAIDs for 2 weeks.  May continue with Tylenol, heat and ice alternating -X-ray imaging to rule out bony abnormalities -Referral to sports medicine for further work-up and imaging Follow-up in 1 month if no improvement.  Garnette Gunner, MD [facility]

## 2019-11-18 NOTE — Patient Instructions (Addendum)
It was a pleasure to see you today! Thank you for choosing [facility] for your primary care. Traci Guzman was seen for right shoulder pain.   For your right shoulder pain, we are starting you on a prescription strength NSAID called naproxen.  Please stop taking ibuprofen.  You may continue take Tylenol as needed.  We are getting a right shoulder x-ray and a chest x-ray.  We are also referring you to sports medicine for additional work-up.  Best,  Thomes Dinning, MD, MS FAMILY MEDICINE RESIDENT - PGY3 11/18/2019 2:22 PM  Family Medicine Community Resource Sheet  Insurance options Medicaid  - local Department of social Services on online applications  DEPARTMENT OF SOCIAL SERVICES: [address] [place], [place] [address]    [phone] [place] [email or web address]   Call [phone] [facility]   [email or web address]   [phone]  [employer]- Pick up application at the front desk  Who Does the [facility] Serve? The [facility] serves any patient with an income between 0%-200% of the federal poverty level.  [facility] patients must be exempt from the Affordable Care Act in order to enroll into the [facility]. omeowners only)   Medication Assistance Program (MAP)-   Who is eligible for MAP services? Adult [facility] residents who have incomes at or below 200% of the [employer] Poverty Scale ($21,660 if single, $29,140 if married). MAP offices are located at the [facility]:  [address] [phone]    [address], [place] [phone]

## 2020-02-06 ENCOUNTER — Other Ambulatory Visit: Payer: Self-pay | Admitting: Family Medicine

## 2020-02-06 DIAGNOSIS — E7800 Pure hypercholesterolemia, unspecified: Secondary | ICD-10-CM

## 2020-05-26 ENCOUNTER — Other Ambulatory Visit: Payer: Self-pay

## 2020-05-26 DIAGNOSIS — E7800 Pure hypercholesterolemia, unspecified: Secondary | ICD-10-CM

## 2020-05-27 MED ORDER — LISINOPRIL 20 MG PO TABS: 20.0000 mg | ORAL_TABLET | Freq: Every day | ORAL | 0 refills | Status: DC

## 2020-05-27 MED ORDER — ATORVASTATIN CALCIUM 40 MG PO TABS: 40.0000 mg | ORAL_TABLET | Freq: Every day | ORAL | 0 refills | Status: DC

## 2020-05-27 NOTE — Telephone Encounter (Signed)
Good morning, I will refill patient's meds but since she has not had a lipid panel since 2019 I would like her to try to come in soon for that. At your convenience, can we try to set up at time that works for the patient where she can come in to get labs and please ask her if she can fast beforehand. Thank you so much

## 2020-05-27 NOTE — Telephone Encounter (Signed)
Attempted to reach pt. No answer. LVM for pt to call office to make an appt to be seen by her PCP since she has not been seen since 2019. Aquilla Solian, CMA

## 2020-10-08 ENCOUNTER — Other Ambulatory Visit: Payer: Self-pay

## 2020-10-08 ENCOUNTER — Ambulatory Visit (INDEPENDENT_AMBULATORY_CARE_PROVIDER_SITE_OTHER): Payer: Self-pay | Admitting: Family Medicine

## 2020-10-08 VITALS — BP 160/90 | HR 97 | Ht 65.0 in | Wt 202.1 lb

## 2020-10-08 DIAGNOSIS — R399 Unspecified symptoms and signs involving the genitourinary system: Secondary | ICD-10-CM | POA: Insufficient documentation

## 2020-10-08 LAB — POCT URINALYSIS DIP (MANUAL ENTRY)
Bilirubin, UA: NEGATIVE
Glucose, UA: NEGATIVE mg/dL
Ketones, POC UA: NEGATIVE mg/dL
Nitrite, UA: POSITIVE — AB
Protein Ur, POC: 30 mg/dL — AB
Spec Grav, UA: 1.02 (ref 1.010–1.025)
Urobilinogen, UA: 2 E.U./dL — AB
pH, UA: 7 (ref 5.0–8.0)

## 2020-10-08 LAB — POCT UA - MICROSCOPIC ONLY

## 2020-10-08 MED ORDER — CEPHALEXIN 500 MG PO CAPS: 500.0000 mg | ORAL_CAPSULE | Freq: Four times a day (QID) | ORAL | 0 refills | Status: AC

## 2020-10-08 NOTE — Progress Notes (Signed)
    SUBJECTIVE:   CHIEF COMPLAINT / HPI:   Urinary frequency: Patient presents to clinic today reporting that last Friday she woke up in the middle of the night freezing and cold, was shaking.  Her husband laid a couple of blankets on her and she slowly warmed up.  She then felt tired the next couple of days.  She felt like she was having chills again on Sunday night as well as last night.  Also since then she has been having urinary frequency and even episodes of incontinence.  She has been even having some occasional low back pain.  She denies any pain with urination.  Did not report recent lower abdominal pain.  Preparing for Christmas is very important to her and she has been very busy working towards preparing for this for the last several days.  She admits to not drinking water very much.  She says she is not sure if she is having a urinary tract infection because she has never had one before.  Elevated blood pressure: Her blood pressure today is 160/90, but she reports she did not take her medication last night.  She is prescribed lisinopril 20 mg.  PERTINENT  PMH / PSH:  Patient Active Problem List   Diagnosis Date Noted  . UTI symptoms 10/08/2020  . Chronic right shoulder pain 11/18/2019  . Impingement syndrome of right shoulder 11/11/2019  . Hyperlipidemia 05/22/2019  . Tobacco abuse 05/22/2019  . Costal margin pain 05/22/2019  . Adjustment insomnia 10/23/2017  . Chest pain 06/03/2016  . Cutaneous skin tags 05/19/2015  . Healthcare maintenance 01/01/2015  . Facial weakness 05/20/2014  . Shoulder weakness 04/01/2014  . Chronic rhinitis 02/13/2013  . Obesity (BMI 30.0-34.9) 02/21/2012  . Heart palpitations 02/20/2012  . History of anemia 08/31/2011  . Urticarial rash, chronic since 6/12 08/10/2011  . 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 Depression 06/24/2011  . Arthritis 06/23/2011  . Fibromyalgia 06/23/2011  . Restless leg syndrome 06/23/2011  . HTN  (hypertension) 06/23/2011     OBJECTIVE:   BP (!) 160/90   Pulse 97   Ht 5\' 5"  (1.651 m)   Wt 202 lb 2 oz (91.7 kg)   LMP 05/19/2014   SpO2 98%   BMI 33.64 kg/m    Physical exam: General: Well-appearing patient, no apparent distress, nontoxic-appearing Respiratory: Comfortable work of breathing, speaking complete sentences  ASSESSMENT/PLAN:   UTI symptoms Urinalysis performed in clinic showing signs of urinary tract infection.  Patient has never had 1 before, no history of allergy to antibiotics. -Patient prescribed Keflex 500 mg 1 tablet 4 times daily for the next 5 days -Encouraged to hydrate with water -Strict return precautions given     07/19/2014, DO [facility]

## 2020-10-08 NOTE — Patient Instructions (Signed)
Thank you for coming in to see [place] today! Please see below to review our plan for today's visit:  1. Take Keflex 1 tablet 500mg  4 times daily for the next 5 days. Take all of the meds until they're gone. Still drink plenty of water.  2. If you have more severe symptoms give a call or go to the ED.    Please call the clinic at [phone] if your symptoms worsen or you have any concerns. It was our pleasure to serve you!   Dr. [phone] [facility]

## 2020-10-08 NOTE — Assessment & Plan Note (Signed)
Urinalysis performed in clinic showing signs of urinary tract infection.  Patient has never had 1 before, no history of allergy to antibiotics. -Patient prescribed Keflex 500 mg 1 tablet 4 times daily for the next 5 days -Encouraged to hydrate with water -Strict return precautions given

## 2020-10-19 ENCOUNTER — Other Ambulatory Visit: Payer: Self-pay | Admitting: Family Medicine

## 2020-10-19 DIAGNOSIS — E7800 Pure hypercholesterolemia, unspecified: Secondary | ICD-10-CM

## 2020-10-26 ENCOUNTER — Ambulatory Visit (HOSPITAL_COMMUNITY)
Admission: RE | Admit: 2020-10-26 | Discharge: 2020-10-26 | Disposition: A | Discharge status: Home / Self Care | Payer: Self-pay | Source: Ambulatory Visit | Attending: Family Medicine | Admitting: Family Medicine

## 2020-10-26 ENCOUNTER — Other Ambulatory Visit: Payer: Self-pay

## 2020-10-26 ENCOUNTER — Ambulatory Visit (INDEPENDENT_AMBULATORY_CARE_PROVIDER_SITE_OTHER): Payer: Self-pay | Admitting: Family Medicine

## 2020-10-26 VITALS — BP 132/80 | HR 129 | Temp 99.6°F | Ht 65.5 in | Wt 198.2 lb

## 2020-10-26 DIAGNOSIS — R300 Dysuria: Secondary | ICD-10-CM | POA: Insufficient documentation

## 2020-10-26 DIAGNOSIS — R000 Tachycardia, unspecified: Secondary | ICD-10-CM | POA: Insufficient documentation

## 2020-10-26 HISTORY — DX: Tachycardia, unspecified: R00.0

## 2020-10-26 HISTORY — DX: Dysuria: R30.0

## 2020-10-26 LAB — POCT URINALYSIS DIP (MANUAL ENTRY)
Bilirubin, UA: NEGATIVE
Glucose, UA: NEGATIVE mg/dL
Nitrite, UA: NEGATIVE
Protein Ur, POC: 30 mg/dL — AB
Spec Grav, UA: >=1.03 — AB (ref 1.010–1.025)
Urobilinogen, UA: 1 E.U./dL
pH, UA: 5.5 (ref 5.0–8.0)

## 2020-10-26 MED ORDER — CIPROFLOXACIN HCL 500 MG PO TABS
500.0000 mg | ORAL_TABLET | Freq: Two times a day (BID) | ORAL | 0 refills | Status: DC
Start: 2020-10-26 — End: 2020-10-28

## 2020-10-26 NOTE — Assessment & Plan Note (Signed)
Acute. Five days of dysuria, hematuria, bilateral CVA tenderness, tactile fevers/chills/night sweats.  No further fevers since Sunday per patient report.  Tachycardia today, otherwise very well-appearing on exam.  She does have suprapubic pain and CVA tenderness bilaterally.  Urinalysis not convincing for UTI however symptoms and physical exam findings are concerning.  Will empirically treat with Cipro x3 days and follow-up culture.  Will finish remaining treatment/change treatment course pending culture results.  If culture negative, patient will need to follow-up in clinic for further evaluation for cause of dysuria, hematuria.  Will need to consider urology referral if culture negative for further evaluation of dysuria and hematuria given her history of tobacco abuse.

## 2020-10-26 NOTE — Assessment & Plan Note (Addendum)
Incidental finding. Asymptomatic. Initial HR 129, repeat 117. Endorses history of intermittent palpitations which has worsened recently. Has seen cardiology in past (~5 years ago) where they monitored her with holter monitor x 24 hours. Denies any treatment at that time.  -  EKG overall normal with no arrhythmia or ST changes. Ventricular rate 99. - obtain CBC, TSH, BMP, Mag - follow up results - pending results, likely benefit from referral back to cardiology for further evaluation given more frequent palpatations

## 2020-10-26 NOTE — Patient Instructions (Signed)
It was a pleasure to see you today!  Thank you for choosing [facility] for your primary care.   Our plans for today were:  Please take Ciprofloxacin 500mg  twice a day for 3 days  I will follow up the urine culture and call you if any further treatment or changes need to be made  Please contact if you are worsening or go to the ED  Best Wishes,   [place], DO

## 2020-10-26 NOTE — Progress Notes (Signed)
Subjective:   Patient ID: Traci Guzman    DOB: [birth year], 55 y.o. female   MRN: [id number]  Traci Guzman is a 55 y.o. female with a history of HTN, chronic rhinitis, arthritis, dyshidrotic dermatitis, insomnia, depression, fibromyalgia, heart palpitations, HLD, impingement syndrome, obesity, RLS, tobacco abuse here for UTI symptoms.  Dysuria:  Patient endorses dysuria, hematuria, bilateral mid back pain, and night sweats starting Thursday evening.  She notes that she had night sweats Friday and all day Saturday.  She has not had any further fevers since Sunday.  Denies any other symptoms including cough, congestion, nausea, vomiting, body aches.  She has been Covid vaccinated x2.  She notes that she had a UTI in December and was treated with Keflex for 5 days, which she completed.  Review of Systems:  Per HPI.   Objective:   BP 132/80   Pulse (!) 129   Temp 99.6 F (37.6 C)   Ht 5' 5.5" (1.664 m)   Wt 198 lb 3.2 oz (89.9 kg)   LMP 05/19/2014   SpO2 96%   BMI 32.48 kg/m  Vitals and nursing note reviewed.  General: pleasant older female, sitting comfortably in exam chair, well nourished, well developed, in no acute distress with non-toxic appearance CV: tachycardia but regular rhythm without murmurs, rubs, or gallops Lungs: clear to auscultation bilaterally with normal work of breathing on room air, speaking in full sentences Abdomen: Suprapubic tenderness Skin: warm, dry Extremities: warm and well perfused MSK: Bilateral CVA tenderness, gait normal Neuro: Alert and oriented, speech normal  EKG: with some artifact but overall NSR, Ventricular rate 99, normal rhythm and rate, no ST changes  Assessment & Plan:   Dysuria Acute. Five days of dysuria, hematuria, bilateral CVA tenderness, tactile fevers/chills/night sweats.  No further fevers since Sunday per patient report.  Tachycardia today, otherwise very well-appearing on exam.  She does have suprapubic pain and CVA  tenderness bilaterally.  Urinalysis not convincing for UTI however symptoms and physical exam findings are concerning.  Will empirically treat with Cipro x3 days and follow-up culture.  Will finish remaining treatment/change treatment course pending culture results.  If culture negative, patient will need to follow-up in clinic for further evaluation for cause of dysuria, hematuria.  Will need to consider urology referral if culture negative for further evaluation of dysuria and hematuria given her history of tobacco abuse.  Tachycardia Incidental finding. Asymptomatic. Initial HR 129, repeat 117. Endorses history of intermittent palpitations which has worsened recently. Has seen cardiology in past (~5 years ago) where they monitored her with holter monitor x 24 hours. Denies any treatment at that time.  -  EKG overall normal with no arrhythmia or ST changes. Ventricular rate 99. - obtain CBC, TSH, BMP, Mag - follow up results - pending results, likely benefit from referral back to cardiology for further evaluation given more frequent palpatations  Orders Placed This Encounter  Procedures  . Urine Culture  . CBC with Differential  . Basic Metabolic Panel  . Magnesium  . TSH  . POCT urinalysis dipstick  . EKG 12-Lead  . EKG 12-Lead    Ordered by an unspecified provider   . EKG 12-Lead    Ordered by an unspecified provider    Meds ordered this encounter  Medications  . ciprofloxacin (CIPRO) 500 MG tablet    Sig: Take 1 tablet (500 mg total) by mouth 2 (two) times daily.    Dispense:  6 tablet    Refill:  0    Orpah Cobb, DO PGY-3, [facility] 10/26/2020 12:56 PM

## 2020-10-27 LAB — CBC WITH DIFFERENTIAL/PLATELET
Basophils Absolute: 0.1 x10E3/uL (ref 0.0–0.2)
Basos: 0 %
EOS (ABSOLUTE): 0.1 x10E3/uL (ref 0.0–0.4)
Eos: 0 %
Hematocrit: 39 % (ref 34.0–46.6)
Hemoglobin: 13.3 g/dL (ref 11.1–15.9)
Immature Grans (Abs): 0 x10E3/uL (ref 0.0–0.1)
Immature Granulocytes: 0 %
Lymphocytes Absolute: 1.6 x10E3/uL (ref 0.7–3.1)
Lymphs: 11 %
MCH: 30.8 pg (ref 26.6–33.0)
MCHC: 34.1 g/dL (ref 31.5–35.7)
MCV: 90 fL (ref 79–97)
Monocytes Absolute: 2 x10E3/uL — ABNORMAL HIGH (ref 0.1–0.9)
Monocytes: 14 %
Neutrophils Absolute: 11.1 x10E3/uL — ABNORMAL HIGH (ref 1.4–7.0)
Neutrophils: 75 %
Platelets: 252 x10E3/uL (ref 150–450)
RBC: 4.32 x10E6/uL (ref 3.77–5.28)
RDW: 13.3 % (ref 11.7–15.4)
WBC: 14.8 x10E3/uL — ABNORMAL HIGH (ref 3.4–10.8)

## 2020-10-27 LAB — BASIC METABOLIC PANEL
BUN/Creatinine Ratio: 15 (ref 9–23)
BUN: 14 mg/dL (ref 6–24)
CO2: 22 mmol/L (ref 20–29)
Calcium: 9.7 mg/dL (ref 8.7–10.2)
Chloride: 98 mmol/L (ref 96–106)
Creatinine, Ser: 0.92 mg/dL (ref 0.57–1.00)
GFR calc Af Amer: 82 mL/min/{1.73_m2} (ref >59)
GFR calc non Af Amer: 71 mL/min/{1.73_m2} (ref >59)
Glucose: 111 mg/dL — ABNORMAL HIGH (ref 65–99)
Potassium: 4 mmol/L (ref 3.5–5.2)
Sodium: 138 mmol/L (ref 134–144)

## 2020-10-27 LAB — TSH: TSH: 0.238 u[IU]/mL — ABNORMAL LOW (ref 0.450–4.500)

## 2020-10-27 LAB — MAGNESIUM: Magnesium: 2.1 mg/dL (ref 1.6–2.3)

## 2020-10-27 NOTE — Addendum Note (Signed)
Addended by: Joana Reamer on: 10/27/2020 09:11 AM   Modules accepted: Orders

## 2020-10-28 LAB — URINE CULTURE

## 2020-10-28 MED ORDER — CIPROFLOXACIN HCL 500 MG PO TABS
500.0000 mg | ORAL_TABLET | Freq: Two times a day (BID) | ORAL | 0 refills | Status: AC
Start: 2020-10-28 — End: 2020-11-01

## 2020-10-28 NOTE — Addendum Note (Signed)
Addended by: Joana Reamer on: 10/28/2020 11:33 AM   Modules accepted: Orders

## 2020-10-30 LAB — SPECIMEN STATUS REPORT

## 2020-10-30 LAB — T4, FREE: Free T4: 0.97 ng/dL (ref 0.82–1.77)

## 2020-11-09 ENCOUNTER — Ambulatory Visit (INDEPENDENT_AMBULATORY_CARE_PROVIDER_SITE_OTHER): Payer: Self-pay | Admitting: Family Medicine

## 2020-11-09 ENCOUNTER — Other Ambulatory Visit: Payer: Self-pay

## 2020-11-09 ENCOUNTER — Encounter: Payer: Self-pay | Admitting: Family Medicine

## 2020-11-09 VITALS — BP 150/88 | HR 84 | Ht 66.0 in | Wt 202.5 lb

## 2020-11-09 DIAGNOSIS — Z1159 Encounter for screening for other viral diseases: Secondary | ICD-10-CM

## 2020-11-09 DIAGNOSIS — Z114 Encounter for screening for human immunodeficiency virus [HIV]: Secondary | ICD-10-CM

## 2020-11-09 DIAGNOSIS — I10 Essential (primary) hypertension: Secondary | ICD-10-CM

## 2020-11-09 DIAGNOSIS — E0590 Thyrotoxicosis, unspecified without thyrotoxic crisis or storm: Secondary | ICD-10-CM

## 2020-11-09 DIAGNOSIS — R319 Hematuria, unspecified: Secondary | ICD-10-CM

## 2020-11-09 DIAGNOSIS — M797 Fibromyalgia: Secondary | ICD-10-CM

## 2020-11-09 LAB — POCT URINALYSIS DIP (MANUAL ENTRY)
Bilirubin, UA: NEGATIVE
Glucose, UA: NEGATIVE mg/dL
Ketones, POC UA: NEGATIVE mg/dL
Leukocytes, UA: NEGATIVE
Nitrite, UA: NEGATIVE
Protein Ur, POC: NEGATIVE mg/dL
Spec Grav, UA: 1.02 (ref 1.010–1.025)
Urobilinogen, UA: 0.2 E.U./dL
pH, UA: 6 (ref 5.0–8.0)

## 2020-11-09 LAB — POCT UA - MICROSCOPIC ONLY

## 2020-11-09 NOTE — Progress Notes (Signed)
    SUBJECTIVE:   CHIEF COMPLAINT / HPI:   Tachycardia Traci Guzman presents for follow up after experiencing tachycardia with HR 129 at last visit. States that she has been feeling better compared to her last clinic visit. Denies chest pain, dyspnea, nausea and vomiting. Still endorses palpitations that she has at baseline but have not worsened. Tachycardia thought to be due to urinary infection. Has cut down at caffeine intake and now drinks 10 ounces of coffee per day and 1 pint of soda every other day.  Subclinical hyperthyroidism Low TSH 0.238 and normal T4. Not on any pharmacotherapy at this time. Would benefit from further thyroid blood work.   Hematuria Traci Guzman presented with hematuria at her list visit. Denies fever, chills, night sweats, cough and congestion. Denies dysuria or hematuria currently. Will repeat UA to ensure clearance of urinary infection.   PERTINENT  PMH / PSH:   Hypertension Denies chest pain and dyspnea. Compliant on lisinopril 20 mg daily, tolerating medication well without complications. States that in the past, her blood pressures have fluctuated between normal range and hypertensive in the office.   Fibromyalgia/Depression Endorsing chronic pain especially along the shoulders bilaterally and back for which she uses a maximum OTC dose of both ibuprofen and tylenol. States that her mood is stable but has been enduring a lot of stressors recently. Her husband requires assistance given other comorbidities, she has 3 children at home, husband's brother recently died from COVID and teenage daughter recently discharged from the hospital after suicide attempt. States that her adult daughter and son are her support system as she does not like to burden her husband. Does not currently seek therapy given lack of financial resources and time.   OBJECTIVE:   BP (!) 150/88   Pulse 84   Ht 5\' 6"  (1.676 m)   Wt 202 lb 8 oz (91.9 kg)   LMP 05/19/2014   SpO2 98%   BMI 32.68  kg/m   General: Traci Guzman well-appearing, in no acute distress. HEENT: supple neck, nontender thyroid, no lymphadenopathy  CV: RRR, no murmurs or gallops auscultated Resp: lungs clear to auscultation bilaterally  Abdomen: soft, nontender, presence of active bowel sounds Ext: no LE edema noted bilaterally, radial pulses strong and equal bilaterally Neuro: normal gait, alert, cooperative Psych: mood appropriate, denies SI  ASSESSMENT/PLAN:   Subclinical hyperthyroidism -tachycardia possibly due to thyroid component, T3 normal and pending TSH antibody -no medications at this time   Hematuria -symptomatically resolved -repeat UA negative overall, discussed with Traci Guzman   HTN (hypertension) -BP 150/88, fluctuating blood pressures per chart review, worsened by chronic pain Traci Guzman experiences at baseline -tachycardia improved, HR 84 -continue lisinopril 20 mg daily -diet and exercise counseling   Fibromyalgia -stretches provided -encouraged self-care and sleep hygiene  -consider outpatient PT and additional pain medication as needed -PHQ-9 score of 2 reviewed and discussed     -Negative Hep C and HIV screenings.  -Traci Guzman received both doses of COVID, 05/29/2020 and 06/23/2020 respectively.   08/23/2020, DO [facility]

## 2020-11-09 NOTE — Patient Instructions (Addendum)
It was great seeing you today!  I am glad your heart rate has returned to normal range. Please continue to have a low caffeine intake and practice sleep hygiene: minimize screen time before bed, medication, bedtime routine and keeping a sleep journal. Medication and yoga can help. I have attached some exercises to help with your fibromyalgia. I will contact you regarding any abnormal results from blood work today. Please take time to take care of yourself.   Please follow up at your next scheduled appointment, if anything arises between now and then, please don't hesitate to contact our office.   Thank you for allowing [place] to be a part of your medical care!  Thank you, Dr. Robyne Peers   Back Exercises The following exercises strengthen the muscles that help to support the trunk and back. They also help to keep the lower back flexible. Doing these exercises can help to prevent back pain or lessen existing pain.  If you have back pain or discomfort, try doing these exercises 2-3 times each day or as told by your health care provider.  As your pain improves, do them once each day, but increase the number of times that you repeat the steps for each exercise (do more repetitions).  To prevent the recurrence of back pain, continue to do these exercises once each day or as told by your health care provider. Do exercises exactly as told by your health care provider and adjust them as directed. It is normal to feel mild stretching, pulling, tightness, or discomfort as you do these exercises, but you should stop right away if you feel sudden pain or your pain gets worse. Exercises Single knee to chest Repeat these steps 3-5 times for each leg: 1. Lie on your back on a firm bed or the floor with your legs extended. 2. Bring one knee to your chest. Your other leg should stay extended and in contact with the floor. 3. Hold your knee in place by grabbing your knee or thigh with both hands and hold. 4. Pull on  your knee until you feel a gentle stretch in your lower back or buttocks. 5. Hold the stretch for 10-30 seconds. 6. Slowly release and straighten your leg. Pelvic tilt Repeat these steps 5-10 times: 1. Lie on your back on a firm bed or the floor with your legs extended. 2. Bend your knees so they are pointing toward the ceiling and your feet are flat on the floor. 3. Tighten your lower abdominal muscles to press your lower back against the floor. This motion will tilt your pelvis so your tailbone points up toward the ceiling instead of pointing to your feet or the floor. 4. With gentle tension and even breathing, hold this position for 5-10 seconds. Cat-cow Repeat these steps until your lower back becomes more flexible: 1. Get into a hands-and-knees position on a firm surface. Keep your hands under your shoulders, and keep your knees under your hips. You may place padding under your knees for comfort. 2. Let your head hang down toward your chest. Contract your abdominal muscles and point your tailbone toward the floor so your lower back becomes rounded like the back of a cat. 3. Hold this position for 5 seconds. 4. Slowly lift your head, let your abdominal muscles relax and point your tailbone up toward the ceiling so your back forms a sagging arch like the back of a cow. 5. Hold this position for 5 seconds.   Press-ups Repeat these steps 5-10  times: 1. Lie on your abdomen (face-down) on the floor. 2. Place your palms near your head, about shoulder-width apart. 3. Keeping your back as relaxed as possible and keeping your hips on the floor, slowly straighten your arms to raise the top half of your body and lift your shoulders. Do not use your back muscles to raise your upper torso. You may adjust the placement of your hands to make yourself more comfortable. 4. Hold this position for 5 seconds while you keep your back relaxed. 5. Slowly return to lying flat on the floor.   Bridges Repeat these  steps 10 times: 1. Lie on your back on a firm surface. 2. Bend your knees so they are pointing toward the ceiling and your feet are flat on the floor. Your arms should be flat at your sides, next to your body. 3. Tighten your buttocks muscles and lift your buttocks off the floor until your waist is at almost the same height as your knees. You should feel the muscles working in your buttocks and the back of your thighs. If you do not feel these muscles, slide your feet 1-2 inches farther away from your buttocks. 4. Hold this position for 3-5 seconds. 5. Slowly lower your hips to the starting position, and allow your buttocks muscles to relax completely. If this exercise is too easy, try doing it with your arms crossed over your chest.   Abdominal crunches Repeat these steps 5-10 times: 1. Lie on your back on a firm bed or the floor with your legs extended. 2. Bend your knees so they are pointing toward the ceiling and your feet are flat on the floor. 3. Cross your arms over your chest. 4. Tip your chin slightly toward your chest without bending your neck. 5. Tighten your abdominal muscles and slowly raise your trunk (torso) high enough to lift your shoulder blades a tiny bit off the floor. Avoid raising your torso higher than that because it can put too much stress on your low back and does not help to strengthen your abdominal muscles. 6. Slowly return to your starting position. Back lifts Repeat these steps 5-10 times: 1. Lie on your abdomen (face-down) with your arms at your sides, and rest your forehead on the floor. 2. Tighten the muscles in your legs and your buttocks. 3. Slowly lift your chest off the floor while you keep your hips pressed to the floor. Keep the back of your head in line with the curve in your back. Your eyes should be looking at the floor. 4. Hold this position for 3-5 seconds. 5. Slowly return to your starting position. Contact a health care provider if:  Your back  pain or discomfort gets much worse when you do an exercise.  Your worsening back pain or discomfort does not lessen within 2 hours after you exercise. If you have any of these problems, stop doing these exercises right away. Do not do them again unless your health care provider says that you can. Get help right away if:  You develop sudden, severe back pain. If this happens, stop doing the exercises right away. Do not do them again unless your health care provider says that you can. This information is not intended to replace advice given to you by your health care provider. Make sure you discuss any questions you have with your health care provider. Document Revised: 02/07/2019 Document Reviewed: 07/05/2018 Elsevier Patient Education  2021 [employer].

## 2020-11-10 ENCOUNTER — Encounter: Payer: Self-pay | Admitting: Family Medicine

## 2020-11-10 DIAGNOSIS — R319 Hematuria, unspecified: Secondary | ICD-10-CM | POA: Insufficient documentation

## 2020-11-10 DIAGNOSIS — E0590 Thyrotoxicosis, unspecified without thyrotoxic crisis or storm: Secondary | ICD-10-CM | POA: Insufficient documentation

## 2020-11-10 HISTORY — DX: Hematuria, unspecified: R31.9

## 2020-11-10 NOTE — Assessment & Plan Note (Signed)
-  tachycardia possibly due to thyroid component, T3 normal and pending TSH antibody -no medications at this time

## 2020-11-10 NOTE — Assessment & Plan Note (Signed)
-  BP 150/88, fluctuating blood pressures per chart review, worsened by chronic pain patient experiences at baseline -tachycardia improved, HR 84 -continue lisinopril 20 mg daily -diet and exercise counseling

## 2020-11-10 NOTE — Assessment & Plan Note (Signed)
-  symptomatically resolved -repeat UA negative overall, discussed with patient

## 2020-11-10 NOTE — Assessment & Plan Note (Signed)
-  stretches provided -encouraged self-care and sleep hygiene  -consider outpatient PT and additional pain medication as needed -PHQ-9 score of 2 reviewed and discussed

## 2020-11-11 LAB — HEPATITIS C ANTIBODY: Hep C Virus Ab: <0.1 s/co ratio (ref 0.0–0.9)

## 2020-11-11 LAB — HIV ANTIBODY (ROUTINE TESTING W REFLEX): HIV Screen 4th Generation wRfx: NONREACTIVE

## 2020-11-11 LAB — T3, FREE: T3, Free: 2.7 pg/mL (ref 2.0–4.4)

## 2020-11-11 LAB — THYROID STIMULATING IMMUNOGLOBULIN: Thyroid Stim Immunoglobulin: <0.1 IU/L (ref 0.00–0.55)

## 2021-01-30 ENCOUNTER — Other Ambulatory Visit: Payer: Self-pay | Admitting: Family Medicine

## 2021-01-30 DIAGNOSIS — E7800 Pure hypercholesterolemia, unspecified: Secondary | ICD-10-CM

## 2021-04-12 ENCOUNTER — Other Ambulatory Visit: Payer: Self-pay | Admitting: Family Medicine

## 2021-05-25 ENCOUNTER — Ambulatory Visit (INDEPENDENT_AMBULATORY_CARE_PROVIDER_SITE_OTHER): Payer: Self-pay | Admitting: Family Medicine

## 2021-05-25 ENCOUNTER — Other Ambulatory Visit: Payer: Self-pay

## 2021-05-25 VITALS — BP 128/82 | HR 75 | Wt 200.0 lb

## 2021-05-25 DIAGNOSIS — R350 Frequency of micturition: Secondary | ICD-10-CM

## 2021-05-25 DIAGNOSIS — R6883 Chills (without fever): Secondary | ICD-10-CM

## 2021-05-25 DIAGNOSIS — R399 Unspecified symptoms and signs involving the genitourinary system: Secondary | ICD-10-CM

## 2021-05-25 DIAGNOSIS — M545 Low back pain, unspecified: Secondary | ICD-10-CM

## 2021-05-25 DIAGNOSIS — M549 Dorsalgia, unspecified: Secondary | ICD-10-CM

## 2021-05-25 LAB — POCT URINALYSIS DIP (MANUAL ENTRY)
Bilirubin, UA: NEGATIVE
Glucose, UA: NEGATIVE mg/dL
Ketones, POC UA: NEGATIVE mg/dL
Leukocytes, UA: NEGATIVE
Nitrite, UA: NEGATIVE
Protein Ur, POC: NEGATIVE mg/dL
Spec Grav, UA: 1.01 (ref 1.010–1.025)
Urobilinogen, UA: 0.2 E.U./dL
pH, UA: 6 (ref 5.0–8.0)

## 2021-05-25 LAB — POCT UA - MICROSCOPIC ONLY

## 2021-05-25 MED ORDER — CEPHALEXIN 500 MG PO CAPS: 500.0000 mg | ORAL_CAPSULE | Freq: Two times a day (BID) | ORAL | 0 refills | Status: AC

## 2021-05-25 NOTE — Patient Instructions (Signed)
It was wonderful to meet you today. Thank you for allowing me to be a part of your care. Below is a short summary of what we discussed at your visit today:  UTI symptoms - Take the antibiotic (Keflex) twice daily for 10 days - If your urine culture comes back negative for bacteria, we will have you stop the antibiotic medicine -We will get an ultrasound of your kidneys. If the results are normal, I will send you a letter or MyChart message. If the results are abnormal, I will give you a call.   - Drink lots of water -Call us if your symptoms worsen or fail to improve  Fibromyalgia - Make an appointment with the front desk to follow-up with your PCP regarding chronic aches and pains related to fibromyalgia   Please bring all of your medications to every appointment!  If you have any questions or concerns, please do not hesitate to contact us via phone or MyChart message.   Fayette Pho, MD

## 2021-05-25 NOTE — Assessment & Plan Note (Signed)
Given clinical symptoms of UTI, CVA tenderness on exam (L >R), and somewhat recent history of UTI, I will treat empirically for UTI with Keflex twice daily x10 days.  UA without evidence of UTI, negative for leukocytes and nitrites. Sent urine for culture.  Will follow-up with results of UCx, if no growth will DC Keflex.

## 2021-05-25 NOTE — Progress Notes (Signed)
    SUBJECTIVE:   CHIEF COMPLAINT / HPI:   Recurrent UTI? - had UTI in December and January - 2-3 times since January feels like it has come back; did cranberry juice and OTC with relief - 2 weeks duration of recent worsening symptoms - lower back pain, right sided back pain, night chills associated with leg shaking, urinating every 5 minutes - difficulty with emptying bladder - no dysuria - some low grade fevers of 99, 100 - night chills with stiffened shaking legs has happened twice in two weeks - before Dec/Jan had never had UTI - last period 4 years ago, no vaginal bleeding since - tx Dec with keflex - tx Jan with cipro x5 days  PERTINENT  PMH / PSH: Fibromyalgia, RLS, depression, subclinical hypothyroidismm, HTN, chronic rhinitis  OBJECTIVE:   BP 128/82   Pulse 75   Wt 200 lb (90.7 kg)   LMP 05/19/2014   SpO2 98%   BMI 32.28 kg/m    PHQ-9:  Depression screen [facility] 2/9 05/25/2021 11/09/2020 10/26/2020  Decreased Interest 0 0 0  Down, Depressed, Hopeless 1 0 1  PHQ - 2 Score 1 0 1  Altered sleeping 0 1 0  Tired, decreased energy 1 1 1   Change in appetite 0 0 0  Feeling bad or failure about yourself  1 0 1  Trouble concentrating 0 0 0  Moving slowly or fidgety/restless 0 0 0  Suicidal thoughts 0 0 0  PHQ-9 Score 3 2 3   Difficult doing work/chores - - Not difficult at all  Some recent data might be hidden    GAD-7:  GAD 7 : Generalized Anxiety Score 11/26/2018 10/23/2017  Nervous, Anxious, on Edge 2 0  Control/stop worrying 3 2  Worry too much - different things 3 3  Trouble relaxing 1 0  Restless 0 1  Easily annoyed or irritable 2 3  Afraid - awful might happen 1 1  Total GAD 7 Score 12 10  Anxiety Difficulty - Not difficult at all   Physical Exam General: Awake, alert, oriented Cardiovascular: Regular rate and rhythm, S1 and S2 present, no murmurs auscultated Respiratory: Lung fields clear to auscultation bilaterally MSK: CVA tenderness, L worse than  R Extremities: No bilateral lower extremity edema, palpable pedal and pretibial pulses bilaterally Neuro: Cranial nerves II through X grossly intact, able to move all extremities spontaneously  ASSESSMENT/PLAN:   UTI symptoms Given clinical symptoms of UTI, CVA tenderness on exam (L >R), and somewhat recent history of UTI, I will treat empirically for UTI with Keflex twice daily x10 days.  UA without evidence of UTI, negative for leukocytes and nitrites. Sent urine for culture.  Will follow-up with results of UCx, if no growth will DC Keflex.     01/25/2019, MD [facility]

## 2021-05-27 LAB — URINE CULTURE: Organism ID, Bacteria: NO GROWTH

## 2021-05-28 ENCOUNTER — Other Ambulatory Visit: Payer: Self-pay

## 2021-05-28 ENCOUNTER — Ambulatory Visit
Admission: RE | Admit: 2021-05-28 | Discharge: 2021-05-28 | Disposition: A | Discharge status: Home / Self Care | Payer: Self-pay | Source: Ambulatory Visit | Attending: Family Medicine | Admitting: Family Medicine

## 2021-05-28 ENCOUNTER — Telehealth: Payer: Self-pay | Admitting: Family Medicine

## 2021-05-28 ENCOUNTER — Other Ambulatory Visit: Payer: Self-pay | Admitting: Family Medicine

## 2021-05-28 DIAGNOSIS — M545 Low back pain, unspecified: Secondary | ICD-10-CM

## 2021-05-28 DIAGNOSIS — R350 Frequency of micturition: Secondary | ICD-10-CM

## 2021-05-28 DIAGNOSIS — R6883 Chills (without fever): Secondary | ICD-10-CM

## 2021-05-28 DIAGNOSIS — M549 Dorsalgia, unspecified: Secondary | ICD-10-CM

## 2021-05-28 DIAGNOSIS — R39859 Costovertebral (angle) tenderness, unspecified side: Secondary | ICD-10-CM

## 2021-05-28 NOTE — Telephone Encounter (Signed)
Called patient regarding urine culture results resulting negative.  Instructed patient to stop her antibiotics.  Patient reports she feels about the same, with no improvement of symptoms since taking antibiotics anyway.  Just had renal ultrasound performed this afternoon, reports quite a bit of back pain afterwards.  She sat in the car after ultrasound and "felt as though as about to cry".  Reports back/flank pain on the left which is odd because normally her right side hurts.  Schedule patient for first available clinic appointment on 8/22 at 9:10 AM with access to care pool.  Gave patient return precautions, reminded her of the after-hours line, and gave her criteria for seeking care at urgent care.  Fayette Pho, MD

## 2021-05-31 ENCOUNTER — Encounter: Payer: Self-pay | Admitting: Family Medicine

## 2021-06-07 ENCOUNTER — Ambulatory Visit: Payer: Self-pay

## 2021-06-16 ENCOUNTER — Ambulatory Visit (INDEPENDENT_AMBULATORY_CARE_PROVIDER_SITE_OTHER): Payer: Self-pay | Admitting: Family Medicine

## 2021-06-16 ENCOUNTER — Encounter: Payer: Self-pay | Admitting: Family Medicine

## 2021-06-16 ENCOUNTER — Other Ambulatory Visit: Payer: Self-pay

## 2021-06-16 VITALS — BP 132/78 | HR 80 | Ht 66.0 in | Wt 196.0 lb

## 2021-06-16 DIAGNOSIS — R350 Frequency of micturition: Secondary | ICD-10-CM

## 2021-06-16 DIAGNOSIS — E785 Hyperlipidemia, unspecified: Secondary | ICD-10-CM

## 2021-06-16 DIAGNOSIS — R399 Unspecified symptoms and signs involving the genitourinary system: Secondary | ICD-10-CM

## 2021-06-16 LAB — POCT URINALYSIS DIP (CLINITEK)
Bilirubin, UA: NEGATIVE
Glucose, UA: NEGATIVE mg/dL
Ketones, POC UA: NEGATIVE mg/dL
Leukocytes, UA: NEGATIVE
Nitrite, UA: NEGATIVE
POC PROTEIN,UA: NEGATIVE
Spec Grav, UA: 1.015 (ref 1.010–1.025)
Urobilinogen, UA: 0.2 E.U./dL
pH, UA: 5.5 (ref 5.0–8.0)

## 2021-06-16 LAB — POCT UA - MICROSCOPIC ONLY

## 2021-06-16 NOTE — Patient Instructions (Signed)
It was nice seeing you today!  If blood work and urine is normal, we can discuss referral to urologist.  Make sure to follow-up with your primary doctor about your chronic issues.  Stay well, Littie Deeds, MD [facility] [phone]

## 2021-06-16 NOTE — Progress Notes (Signed)
    SUBJECTIVE:   CHIEF COMPLAINT / HPI:   Urinary frequency Patient was seen here 3 weeks ago with urinary symptoms, initially started on antibiotics but UA and urine culture ultimately negative so antibiotics were discontinued. Still having urinary frequency. Going twice an hour but only once at night.  Used to be able to go a few hours without urinating.  Denies dysuria, fever.  Denies polyuria and urinary incontinence. Leg cramps have resolved. Still has some lower back pain only when something bumps against it. Does not feel like her fibromyalgia. No recent medication changes.  Weaned herself off of amitriptyline 1 year ago and states she has been doing fine.   HLD Stopped taking atorvastatin 1 month ago due to muscle cramps.  PERTINENT  PMH / PSH: Fibromyalgia  OBJECTIVE:   BP 132/78   Pulse 80   Ht 5\' 6"  (1.676 m)   Wt 196 lb (88.9 kg)   LMP 05/19/2014   BMI 31.64 kg/m   General: Obese middle-aged female, NAD CV: RRR, no murmurs Pulm: CTAB, no wheezes or rales Abdomen: Mild right CVA tenderness, no CVA tenderness on the left  ASSESSMENT/PLAN:   UTI symptoms Urinary frequency persists.  We will repeat UA and urine culture and obtain A1c to rule out underlying diabetes.  If results are still unrevealing, it is possible that she may have interstitial cystitis especially given concomitant history of fibromyalgia.  Discussed possibility of urology referral if results are negative, however patient declined.  Can refer to urology if patient changes her mind.  Hyperlipidemia Repeat lipid panel.  Could consider reducing atorvastatin dose pending results given myalgias.     07/19/2014, MD [facility]

## 2021-06-16 NOTE — Assessment & Plan Note (Signed)
Repeat lipid panel.  Could consider reducing atorvastatin dose pending results given myalgias.

## 2021-06-16 NOTE — Assessment & Plan Note (Signed)
Urinary frequency persists.  We will repeat UA and urine culture and obtain A1c to rule out underlying diabetes.  If results are still unrevealing, it is possible that she may have interstitial cystitis especially given concomitant history of fibromyalgia.  Discussed possibility of urology referral if results are negative, however patient declined.  Can refer to urology if patient changes her mind.

## 2021-06-17 LAB — LIPID PANEL
Chol/HDL Ratio: 6.1 ratio — ABNORMAL HIGH (ref 0.0–4.4)
Cholesterol, Total: 233 mg/dL — ABNORMAL HIGH (ref 100–199)
HDL: 38 mg/dL — ABNORMAL LOW (ref >39)
LDL Chol Calc (NIH): 157 mg/dL — ABNORMAL HIGH (ref 0–99)
Triglycerides: 204 mg/dL — ABNORMAL HIGH (ref 0–149)
VLDL Cholesterol Cal: 38 mg/dL (ref 5–40)

## 2021-06-17 LAB — HEMOGLOBIN A1C
Est. average glucose Bld gHb Est-mCnc: 114 mg/dL
Hgb A1c MFr Bld: 5.6 % (ref 4.8–5.6)

## 2021-06-18 ENCOUNTER — Telehealth: Payer: Self-pay | Admitting: Family Medicine

## 2021-06-18 DIAGNOSIS — E7800 Pure hypercholesterolemia, unspecified: Secondary | ICD-10-CM

## 2021-06-18 LAB — URINE CULTURE: Organism ID, Bacteria: NO GROWTH

## 2021-06-18 MED ORDER — ATORVASTATIN CALCIUM 40 MG PO TABS: 40.0000 mg | ORAL_TABLET | Freq: Every day | ORAL | 0 refills | Status: DC

## 2021-06-18 NOTE — Telephone Encounter (Signed)
Call patient to discuss lab results.  UA and urine culture again are negative.  Discussed that if she wanted a referral to urology at a time, she can give [place] a call.  A1c within normal limits.  Lipid panel is still elevated with ASCVD score 11.2%.  Discussed that she should get back on the atorvastatin if she can tolerate it.  She is amenable to this and requested a refill for this medication so I have ordered this.    The 10-year ASCVD risk score Denman George DC Montez Hageman., et al., 2013) is: 11.2%   Values used to calculate the score:     Age: 55 years     Sex: Female     Is Non-Hispanic African American: No     Diabetic: No     Tobacco smoker: Yes     Systolic Blood Pressure: 132 mmHg     Is BP treated: Yes     HDL Cholesterol: 38 mg/dL     Total Cholesterol: 233 mg/dL

## 2021-07-13 ENCOUNTER — Other Ambulatory Visit: Payer: Self-pay | Admitting: Family Medicine

## 2021-08-10 ENCOUNTER — Ambulatory Visit (INDEPENDENT_AMBULATORY_CARE_PROVIDER_SITE_OTHER): Payer: Self-pay | Admitting: Family Medicine

## 2021-08-10 ENCOUNTER — Other Ambulatory Visit: Payer: Self-pay

## 2021-08-10 VITALS — BP 156/93 | HR 99 | Ht 66.0 in | Wt 196.8 lb

## 2021-08-10 DIAGNOSIS — M25522 Pain in left elbow: Secondary | ICD-10-CM

## 2021-08-10 DIAGNOSIS — Z1231 Encounter for screening mammogram for malignant neoplasm of breast: Secondary | ICD-10-CM

## 2021-08-10 DIAGNOSIS — I10 Essential (primary) hypertension: Secondary | ICD-10-CM

## 2021-08-10 NOTE — Assessment & Plan Note (Signed)
-  unsure of etiology as no recent trauma, possibly fibromyalgia contributing as patient has a history of this, neuropathy also a possibility  -given concern of awakening at night, will obtain imaging, imaging ordered -exercises provided but instructed to only do them once provider informs of imaging results

## 2021-08-10 NOTE — Assessment & Plan Note (Signed)
-  BP 163/105, slightly improved on repeat 156/93 -continue lisinopril -extensive diet and exercise counseling provided -instructed to record daily BP and bring in to next visit, instructions given on guidelines of accurately checking BP -follow up in 1 month, consider increasing lisinopril if appropriate

## 2021-08-10 NOTE — Patient Instructions (Addendum)
It was great seeing you today!  Today we discussed your elbow pain, we ordered imaging for your elbow. Let's wait until the imaging comes back, depending on the results, attached are some elbow stretches that you can try.   Your blood pressure was a little high, please record your BP and bring in this at your next visit. Make sure to eat a balanced diet and stay physically active throughout the day.   Please follow up for your PAP smear.   Please follow up at your next scheduled appointment in 1 month, if anything arises between now and then, please don't hesitate to contact our office.   Thank you for allowing [place] to be a part of your medical care!  Thank you, Dr. Robyne Peers

## 2021-08-10 NOTE — Progress Notes (Signed)
    SUBJECTIVE:   CHIEF COMPLAINT / HPI:   Patient presents with 3-4 month history of left elbow pain. Denies any change in physical activity or other inciting event. States that sometimes it is dull and aching while other times it feels sharp. Reports constant pain that may get better or worse depending on the time of day. Endorses associated burning, numbness and tingling sensation. States that it sometimes feels like a pinching sensation. The pain sometimes wakes her up at night during sleep.   PERTINENT  PMH / PSH:   Hypertension Endorses daily compliance with lisinopril. Denies chest pain and dyspnea. Tries to a healthy diet and exercise but often puts her needs off as she has had a lot of things occurring within her personal life. Her son is moving out of [place], a lot going on with her husband's healthy and recently got custody of kids. 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   OBJECTIVE:   BP (!) 156/93   Pulse 99   Ht 5\' 6"  (1.676 m)   Wt 196 lb 12.8 oz (89.3 kg)   LMP 05/19/2014   SpO2 97%   BMI 31.76 kg/m   General: Patient well-appearing, in no acute distress. CV: RRR, no murmurs or gallops auscultated Resp: CTAB Ext: normal ROM of metacarpals, wrist, elbows and shoulders bilaterally, no erythema or edema noted, no rashes noted Psych: mood appropriate   ASSESSMENT/PLAN:   Left elbow pain -unsure of etiology as no recent trauma, possibly fibromyalgia contributing as patient has a history of this, neuropathy also a possibility  -given concern of awakening at night, will obtain imaging, imaging ordered -exercises provided but instructed to only do them once provider informs of imaging results  HTN (hypertension) -BP 163/105, slightly improved on repeat 156/93 -continue lisinopril -extensive diet and exercise counseling provided -instructed to record daily BP and bring in to next visit, instructions given on guidelines of accurately  checking BP -follow up in 1 month, consider increasing lisinopril if appropriate    Health maintenance -Mammogram ordered, was unable to get last one due to issues with imaging center. -Patient politely declines GI referral for colonoscopy due to financial issues as patient does not have insurance. Instructed her to inform office when she is able to perform this screening, discussed the importance of routine screening. -Follow up in 1 month for PAP smear  -PHQ-9 score of 2 with negative question 9 reviewed and discussed.  07/19/2014, DO [facility]

## 2021-08-11 ENCOUNTER — Encounter: Payer: Self-pay | Admitting: Family Medicine

## 2021-08-11 LAB — BASIC METABOLIC PANEL
BUN/Creatinine Ratio: 16 (ref 9–23)
BUN: 13 mg/dL (ref 6–24)
CO2: 26 mmol/L (ref 20–29)
Calcium: 9.3 mg/dL (ref 8.7–10.2)
Chloride: 105 mmol/L (ref 96–106)
Creatinine, Ser: 0.8 mg/dL (ref 0.57–1.00)
Glucose: 97 mg/dL (ref 70–99)
Potassium: 4.2 mmol/L (ref 3.5–5.2)
Sodium: 143 mmol/L (ref 134–144)
eGFR: 87 mL/min/{1.73_m2} (ref >59)

## 2021-10-18 ENCOUNTER — Other Ambulatory Visit: Payer: Self-pay | Admitting: Family Medicine

## 2021-10-20 ENCOUNTER — Other Ambulatory Visit: Payer: Self-pay

## 2021-10-20 DIAGNOSIS — E7800 Pure hypercholesterolemia, unspecified: Secondary | ICD-10-CM

## 2021-10-20 MED ORDER — ATORVASTATIN CALCIUM 40 MG PO TABS: 40.0000 mg | ORAL_TABLET | Freq: Every day | ORAL | 0 refills | Status: DC

## 2022-01-17 ENCOUNTER — Other Ambulatory Visit: Payer: Self-pay | Admitting: Family Medicine

## 2022-01-17 DIAGNOSIS — E7800 Pure hypercholesterolemia, unspecified: Secondary | ICD-10-CM

## 2022-01-22 IMAGING — US US RENAL
1 series · 14 of 25 positions shown · non-contrast
Comparison: None.

CLINICAL DATA: UTI with bilateral CVA tenderness. Low back pain
with chills and urinary frequency.

EXAM:
RENAL / URINARY TRACT ULTRASOUND COMPLETE

[Series 1: us renal · 0.21mm/px · 14 of 40 slices shown]
[im 1/40]
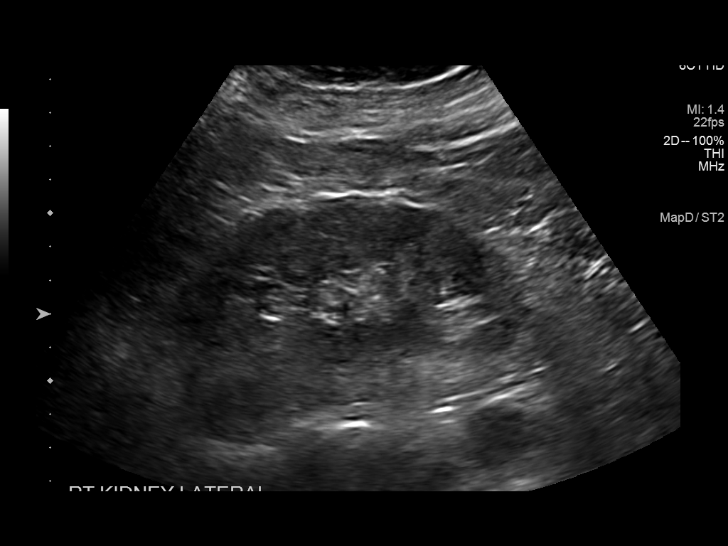
[im 4/40]
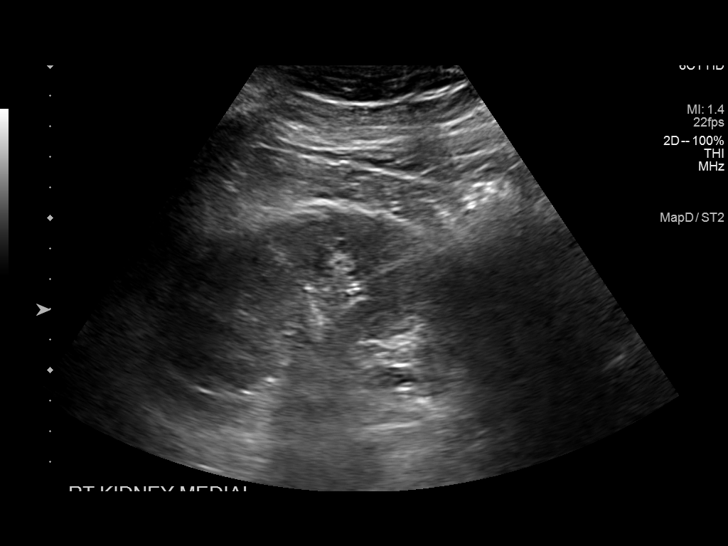
[im 7/40]
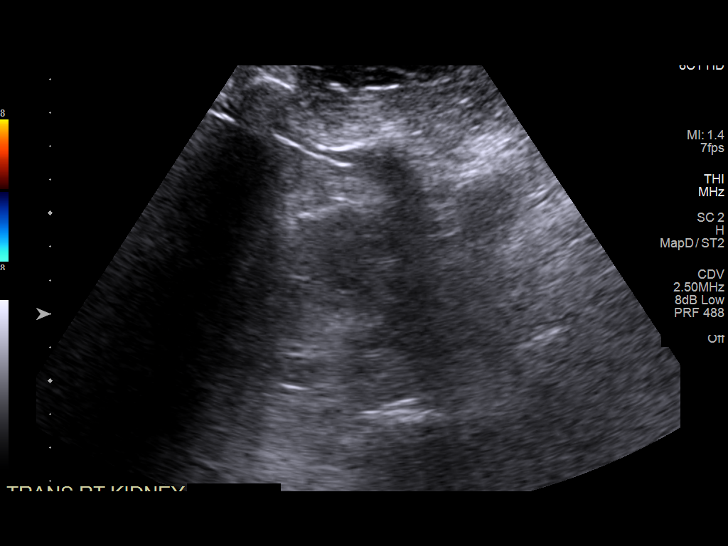
[im 10/40]
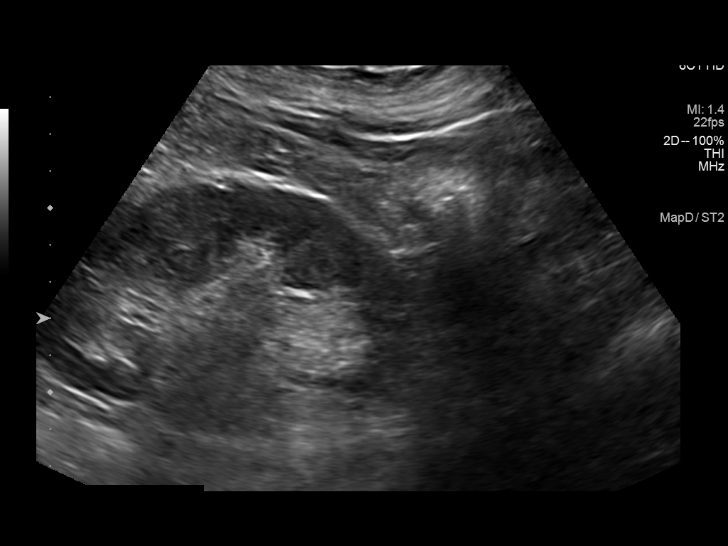
[im 14/40]
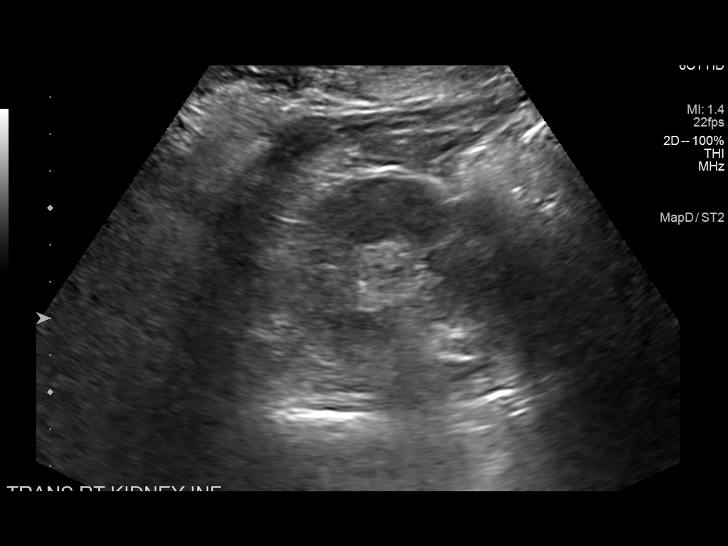
[im 15/40]
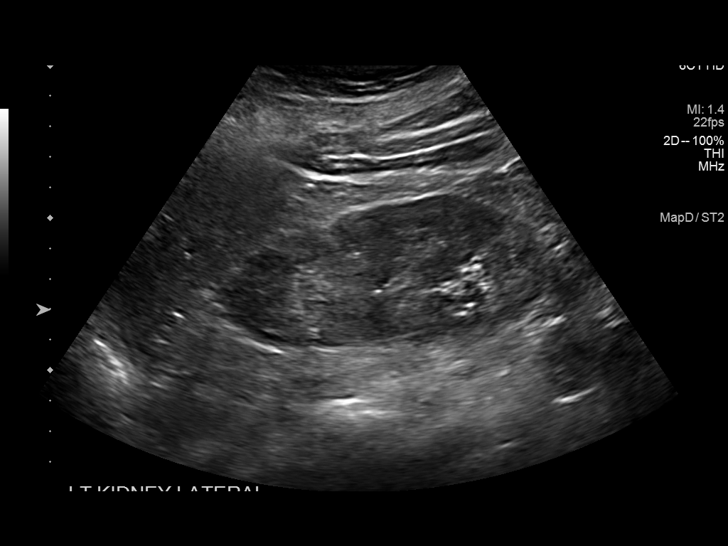
[im 18/40]
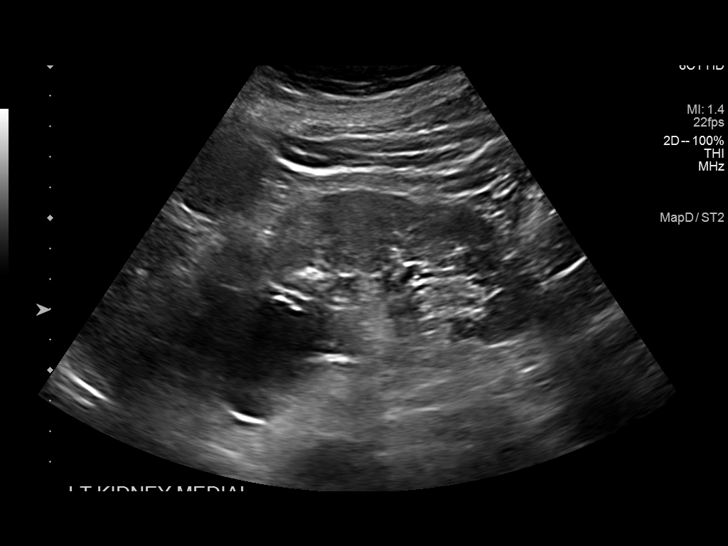
[im 22/40]
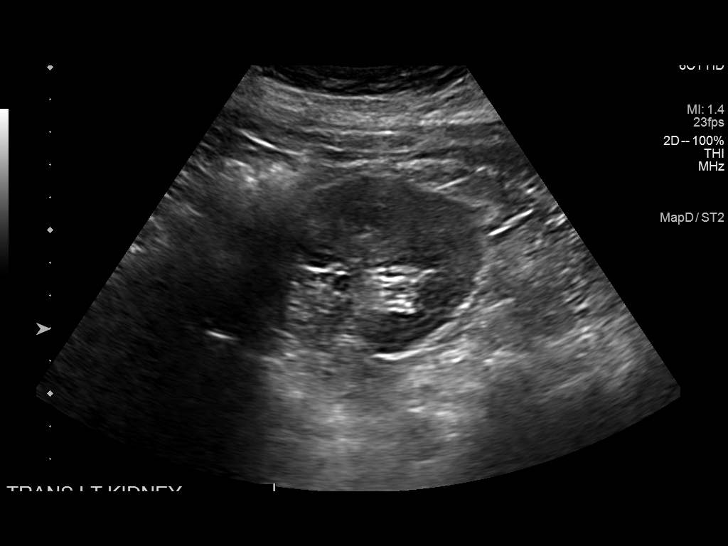
[im 25/40]
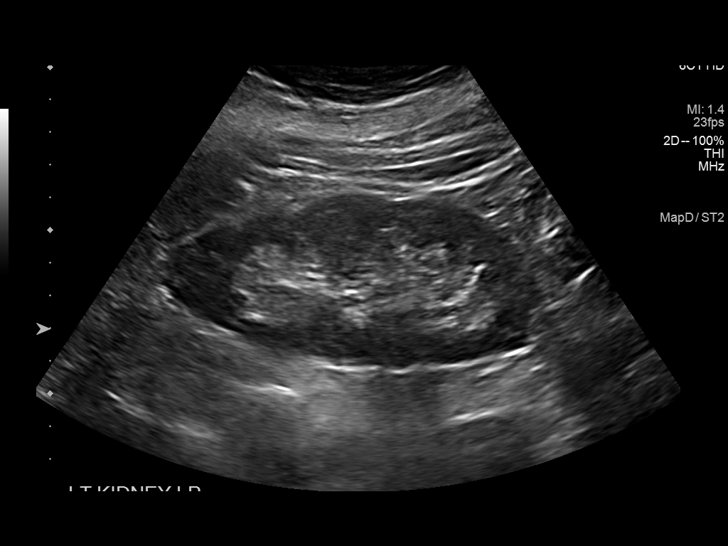
[im 27/40]
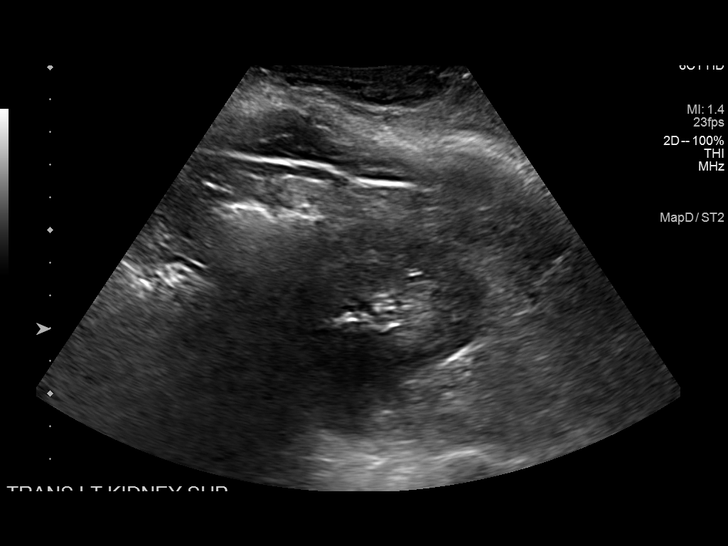
[im 30/40]
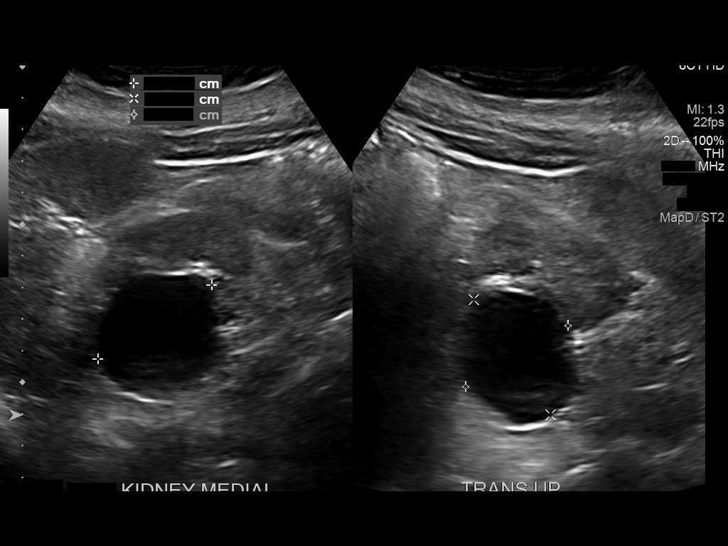
[im 33/40]
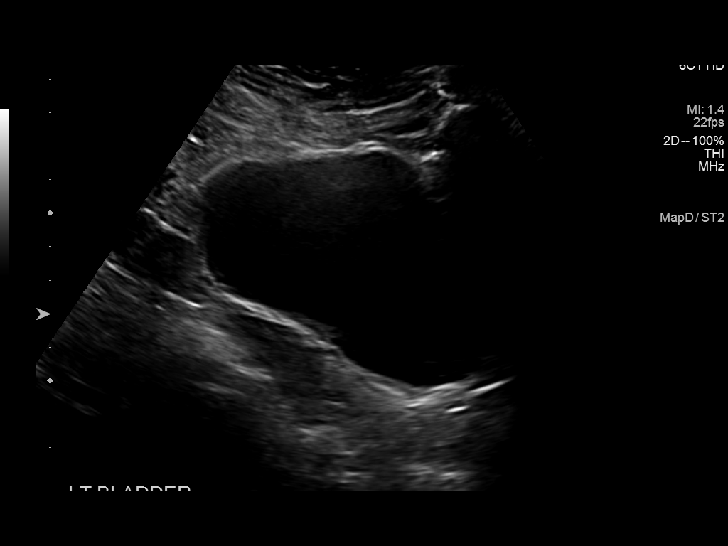
[im 36/40]
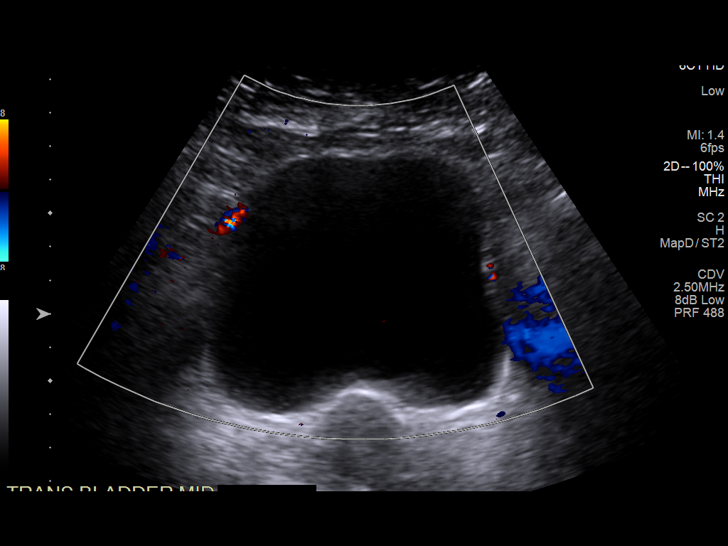
[im 40/40]
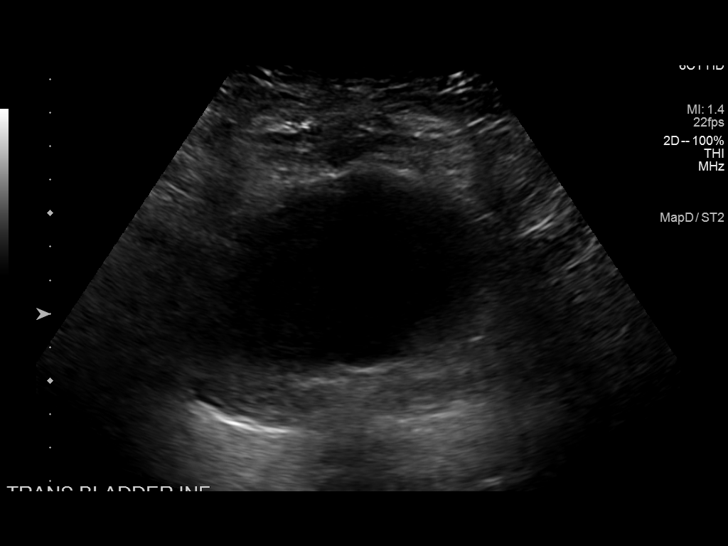

[14 of 25 positions shown; findings below may reference images not displayed]

FINDINGS: Right Kidney:

Renal measurements: 10.8 x 6.0 x 4.4 cm = volume: 147 mL.
Echogenicity within normal limits. No mass or hydronephrosis
visualized.

Left Kidney:

Renal measurements: 11.4 x 5.3 x 5.4 cm = volume: 171 mL.
Echogenicity within normal limits. No mass or hydronephrosis
visualized. 4.3 cm cyst over the upper pole.

Bladder:

Appears normal for degree of bladder distention. Bilateral ureteral
jets visualized.

Other:

None.
IMPRESSION: Normal size kidneys without hydronephrosis.  4.3 cm left renal cyst.

## 2022-04-22 ENCOUNTER — Other Ambulatory Visit: Payer: Self-pay | Admitting: Family Medicine

## 2022-04-22 DIAGNOSIS — E7800 Pure hypercholesterolemia, unspecified: Secondary | ICD-10-CM

## 2022-04-29 ENCOUNTER — Other Ambulatory Visit: Payer: Self-pay | Admitting: Family Medicine

## 2022-08-08 ENCOUNTER — Ambulatory Visit (INDEPENDENT_AMBULATORY_CARE_PROVIDER_SITE_OTHER): Payer: Commercial Managed Care - HMO | Admitting: Family Medicine

## 2022-08-08 ENCOUNTER — Encounter: Payer: Self-pay | Admitting: Family Medicine

## 2022-08-08 VITALS — BP 138/80 | HR 79 | Ht 66.0 in | Wt 198.2 lb

## 2022-08-08 DIAGNOSIS — R109 Unspecified abdominal pain: Secondary | ICD-10-CM

## 2022-08-08 DIAGNOSIS — K219 Gastro-esophageal reflux disease without esophagitis: Secondary | ICD-10-CM | POA: Diagnosis not present

## 2022-08-08 MED ORDER — OMEPRAZOLE 20 MG PO CPDR: 20.0000 mg | DELAYED_RELEASE_CAPSULE | Freq: Every day | ORAL | 3 refills | Status: DC

## 2022-08-08 NOTE — Progress Notes (Signed)
    SUBJECTIVE:   CHIEF COMPLAINT / HPI:   Abdominal pain Patient reports 2 years of epigastric pain which has recently worsened.  She describes the pain as cramping and sharp in her epigastric region.  The pain does not appear to be postprandial and is not worse after lying flat.  She has experienced what she identifies as heartburn symptoms for the last few weeks. She specifies that food does not get stuck in her throat and denies dysphagia.  She has 2-3 bowel movements per week, except for 1 week a month when she has 4 to 5/day.Patient has tried over-the-counter antacid pills with moderate relief. Patient denies nausea, vomiting, hematemesis, hemoptysis, cough, weight loss, weight gain, night fevers, early satiety, or melena.  Patient has a 30 to 35-year smoking history of 1 pack/day.  Currently trying to quit.  OBJECTIVE:   BP 138/80 (BP Location: Right Arm, Patient Position: Sitting, Cuff Size: Normal) Comment: Manual  Pulse 79   Ht 5\' 6"  (1.676 m)   Wt 198 lb 3.2 oz (89.9 kg)   LMP 05/19/2014   SpO2 100%   BMI 31.99 kg/m    08/08/2022 3:08 PM 08/08/2022 4:15 PM  BP 165/95 138/80   General: Resting comfortably in chair, NAD. Cardiovascular: Regular rate and rhythm. Normal S1/S2. No murmurs, rubs, or gallops appreciated. 2+ radial pulses. Pulmonary: Clear bilaterally to ascultation. No increased WOB, no accessory muscle usage. No wheezes, rales, or crackles. Abdominal: Normoactive bowel sounds.  Tenderness to deep palpation over epigastrium.  No rebound or guarding.  No HSM.  No overlying rashes or erythema of skin.  Not able to palpate herniation of bowel contents over location of pain with cough.  Skin: Warm and dry.  No rashes or swelling grossly. Extremities: No peripheral edema bilaterally Psych: mood appropriate, pleasant and conversational   ASSESSMENT/PLAN:   GERD (gastroesophageal reflux disease) Most likely diagnosis of patient's epigastric pain is GERD. Also considered  peptic ulcer disease and pancreatitis also less likely given her symptoms and exam. Ordered lipase to eliminate pancreatitis.  - pending CMP and lipase  - Prescribed trial of 20 mg omeprazole PO daily with option to increase dose if unresponsive to treatment - Discussed smoking cessation and encouraged patient's efforts, will continue discussion  - Patient will follow up in 1 month for further workup or adjustment of meds or sooner if symptoms worsen - no indication for EGD at this time however may consider this and urea breath test to evaluate for h pylori should she not be responsive to treatment after multiple attemps   Healthcare maintenance -Patient agrees to follow up about smoking cessation assistance with Chantix at next visit and will continue her efforts to quit until then. -Patient will also likely need a colonoscopy and other preventative healthcare in the future based on her family history and 35 pack-year smoking history but she politely declines at this time given financial restrictions. Will continue discussion and revisit as appropriate.   Gavynn Duvall Mining engineer, [place]    I was personally present and performed or re-performed the history, physical exam and medical decision making activities of this service and have verified that the service and findings are accurately documented in the student's note. My edits are noted within the note above.  Donney Dice, DO                  08/09/2022, 12:12 PM  PGY-3, [place]

## 2022-08-08 NOTE — Assessment & Plan Note (Addendum)
Most likely diagnosis of patient's epigastric pain is GERD. Also considered peptic ulcer disease and pancreatitis also less likely given her symptoms and exam. Ordered lipase to eliminate pancreatitis.  - pending CMP and lipase  - Prescribed trial of 20 mg omeprazole PO daily with option to increase dose if unresponsive to treatment - Discussed smoking cessation and encouraged patient's efforts, will continue discussion  - Patient will follow up in 1 month for further workup or adjustment of meds or sooner if symptoms worsen - no indication for EGD at this time however may consider this and urea breath test to evaluate for h pylori should she not be responsive to treatment after multiple attemps

## 2022-08-08 NOTE — Patient Instructions (Addendum)
It was great seeing you today!  Today we discussed your abdominal pain, I believe this may be due to reflux. Please take omeprazole 20 mg daily. Please also keep a diary of when you have the stomach pain and what foods are you eating.   Please follow up at your next scheduled appointment in 1 month, if anything arises between now and then, please don't hesitate to contact our office.   Thank you for allowing [place] to be a part of your medical care!  Thank you, Dr. Larae Grooms  Also a reminder of our clinic's no-show policy. Please make sure to arrive at least 15 minutes prior to your scheduled appointment time. Please try to cancel before 24 hours if you are not able to make it. If you no-show for 2 appointments then you will be receiving a warning letter. If you no-show after 3 visits, then you may be at risk of being dismissed from our clinic. This is to ensure that everyone is able to be seen in a timely manner. Thank you, we appreciate your assistance with this!

## 2022-08-10 ENCOUNTER — Encounter: Payer: Self-pay | Admitting: Family Medicine

## 2022-08-10 LAB — COMPREHENSIVE METABOLIC PANEL
ALT: 15 IU/L (ref 0–32)
AST: 19 IU/L (ref 0–40)
Albumin/Globulin Ratio: 2 (ref 1.2–2.2)
Albumin: 4.3 g/dL (ref 3.8–4.9)
Alkaline Phosphatase: 83 IU/L (ref 44–121)
BUN/Creatinine Ratio: 20 (ref 9–23)
BUN: 17 mg/dL (ref 6–24)
Bilirubin Total: 0.3 mg/dL (ref 0.0–1.2)
CO2: 22 mmol/L (ref 20–29)
Calcium: 9.6 mg/dL (ref 8.7–10.2)
Chloride: 106 mmol/L (ref 96–106)
Creatinine, Ser: 0.84 mg/dL (ref 0.57–1.00)
Globulin, Total: 2.2 g/dL (ref 1.5–4.5)
Glucose: 85 mg/dL (ref 70–99)
Potassium: 4.2 mmol/L (ref 3.5–5.2)
Sodium: 143 mmol/L (ref 134–144)
Total Protein: 6.5 g/dL (ref 6.0–8.5)
eGFR: 82 mL/min/{1.73_m2} (ref >59)

## 2022-08-10 LAB — LIPASE: Lipase: 33 U/L (ref 14–72)

## 2022-08-11 ENCOUNTER — Other Ambulatory Visit: Payer: Self-pay | Admitting: Family Medicine

## 2022-08-11 DIAGNOSIS — E7800 Pure hypercholesterolemia, unspecified: Secondary | ICD-10-CM

## 2022-09-05 ENCOUNTER — Encounter: Payer: Self-pay | Admitting: Family Medicine

## 2022-09-05 ENCOUNTER — Other Ambulatory Visit: Payer: Self-pay

## 2022-09-05 ENCOUNTER — Ambulatory Visit (INDEPENDENT_AMBULATORY_CARE_PROVIDER_SITE_OTHER): Payer: Commercial Managed Care - HMO | Admitting: Family Medicine

## 2022-09-05 VITALS — BP 165/99 | HR 87 | Wt 194.0 lb

## 2022-09-05 DIAGNOSIS — K219 Gastro-esophageal reflux disease without esophagitis: Secondary | ICD-10-CM | POA: Diagnosis not present

## 2022-09-05 DIAGNOSIS — E785 Hyperlipidemia, unspecified: Secondary | ICD-10-CM | POA: Diagnosis not present

## 2022-09-05 DIAGNOSIS — I10 Essential (primary) hypertension: Secondary | ICD-10-CM | POA: Diagnosis not present

## 2022-09-05 MED ORDER — LISINOPRIL 30 MG PO TABS: 30.0000 mg | ORAL_TABLET | Freq: Every day | ORAL | 3 refills | Status: DC

## 2022-09-05 NOTE — Assessment & Plan Note (Signed)
-  extensively discussed the importance and benefits of statin therapy for both stroke and heart attack prevention which patient states that she was not initially aware of. Conveyed to patient that she can take a tsp of mustard to relieve these symptoms -may consider lower statin dose if symptoms are persisting which we discussed  -repeat lipid panel at next visit

## 2022-09-05 NOTE — Assessment & Plan Note (Signed)
-  BP 160s systolic and remained the same on repeat -reassuringly patient is asymptomatic  -increased lisinopril to 30 mg daily, prescription sent to desired pharmacy -electrolytes and renal function appropriate from recent BMP -instructed to maintain BP log -follow up in 2 weeks, plan to repeat BMP at that time

## 2022-09-05 NOTE — Assessment & Plan Note (Signed)
-  continue omeprazole 20 mg via shared decision making discussed that patient can continue to eat rice as she does not want to stop and if we need to increase dose soon she will make an appointment with me sooner -maintain food diary -GERD precautions -encouraged smoking cessation as this can assist with improving symptoms along with overall health

## 2022-09-05 NOTE — Patient Instructions (Addendum)
It was great seeing you today!  Today we discussed your abdominal pain, I am glad that the pain has improved. Continue to take omeprazole 20 mg daily. If symptoms are worse then please return to see me sooner.   Your blood pressure was high, I have increased your lisinopril to 30 mg daily. Please check your blood pressure at least twice daily and bring in this log to your next visit.   Please follow up at your next scheduled appointment in 2 weeks, if anything arises between now and then, please don't hesitate to contact our office.   Thank you for allowing [place] to be a part of your medical care!  Thank you, Dr. Robyne Peers  Also a reminder of our clinic's no-show policy. Please make sure to arrive at least 15 minutes prior to your scheduled appointment time. Please try to cancel before 24 hours if you are not able to make it. If you no-show for 2 appointments then you will be receiving a warning letter. If you no-show after 3 visits, then you may be at risk of being dismissed from our clinic. This is to ensure that everyone is able to be seen in a timely manner. Thank you, we appreciate your assistance with this!

## 2022-09-05 NOTE — Progress Notes (Signed)
    SUBJECTIVE:   CHIEF COMPLAINT / HPI:   Patient presents for follow up on her abdominal pain. Reports that her symptoms have improved tremendously. She is getting epigastric pain only during eating, before she started on medication she was getting the pain constantly. The pain will typically last for a few minutes and will resolve within less than 30 minutes. Endorses occasional globulus sensation but denies dysphagia. Describes the pain as cramping and soreness in her epigastric region. Endorses compliance on omeprazole. She was maintaining a food diary, she does not eat any spicy foods or fried foods. When asked, she noticed a pattern with getting these symptoms when she eats rice. She tried chewing more because she initially thought it was due to this but she is still having symptoms when this occurs. Only occurs with eating rice.   Hypertension Denies chest pain or dyspnea. Compliant lisinopril 20 mg daily. She does not check her BP at home. Denies vision changes, headaches or weakness.  Hyperlipidemia  Upon med rec, discussed that patient is not taking her statin for the past 2 weeks as it has been causing her muscle aches which stopped after taking her statin.   OBJECTIVE:   BP (!) 165/99   Pulse 87   Wt 194 lb (88 kg)   LMP 05/19/2014   SpO2 100%   BMI 31.31 kg/m   General: Patient well-appearing, in no acute distress. CV: RRR, no murmurs or gallops auscultated  Resp: CTAB, no wheezing, rales or rhonchi noted Abdomen: soft, nontender, nondistended, presence of bowel sounds  ASSESSMENT/PLAN:   GERD (gastroesophageal reflux disease) -continue omeprazole 20 mg via shared decision making discussed that patient can continue to eat rice as she does not want to stop and if we need to increase dose soon she will make an appointment with me sooner -maintain food diary -GERD precautions -encouraged smoking cessation as this can assist with improving symptoms along with overall  health  HTN (hypertension) -BP 160s systolic and remained the same on repeat -reassuringly patient is asymptomatic  -increased lisinopril to 30 mg daily, prescription sent to desired pharmacy -electrolytes and renal function appropriate from recent BMP -instructed to maintain BP log -follow up in 2 weeks, plan to repeat BMP at that time   Hyperlipidemia -extensively discussed the importance and benefits of statin therapy for both stroke and heart attack prevention which patient states that she was not initially aware of. Conveyed to patient that she can take a tsp of mustard to relieve these symptoms -may consider lower statin dose if symptoms are persisting which we discussed  -repeat lipid panel at next visit    -PHQ-9 score of 7 with negative question 9 reviewed.     Reece Leader, DO [facility] [facility]

## 2022-09-16 ENCOUNTER — Ambulatory Visit: Payer: Commercial Managed Care - HMO | Admitting: Family Medicine

## 2022-09-19 ENCOUNTER — Ambulatory Visit (INDEPENDENT_AMBULATORY_CARE_PROVIDER_SITE_OTHER): Payer: Commercial Managed Care - HMO | Admitting: Family Medicine

## 2022-09-19 ENCOUNTER — Encounter: Payer: Self-pay | Admitting: Family Medicine

## 2022-09-19 VITALS — BP 178/101 | HR 88 | Wt 194.6 lb

## 2022-09-19 DIAGNOSIS — I10 Essential (primary) hypertension: Secondary | ICD-10-CM

## 2022-09-19 MED ORDER — LISINOPRIL-HYDROCHLOROTHIAZIDE 20-12.5 MG PO TABS: 2.0000 | ORAL_TABLET | Freq: Every day | ORAL | 1 refills | Status: DC

## 2022-09-19 NOTE — Patient Instructions (Addendum)
It was great seeing you today!  Today we discussed your blood pressure. I am concerned with your blood pressure being high. Please stop taking the lisinopril alone, I have started you on a combination medication called lisinopril-hydrochlorothiazide. Please take 2 tablets daily and record your BP twice daily, bring this record into your next visit.   If you get chest pain or shortness of breath or your blood pressures are above 160 with weakness, vision changes or headaches then please go to the emergency department.   Please follow up at your next scheduled appointment in 1-2 weeks, if anything arises between now and then, please don't hesitate to contact our office.   Thank you for allowing [place] to be a part of your medical care!  Thank you, Dr. Robyne Peers  Also a reminder of our clinic's no-show policy. Please make sure to arrive at least 15 minutes prior to your scheduled appointment time. Please try to cancel before 24 hours if you are not able to make it. If you no-show for 2 appointments then you will be receiving a warning letter. If you no-show after 3 visits, then you may be at risk of being dismissed from our clinic. This is to ensure that everyone is able to be seen in a timely manner. Thank you, we appreciate your assistance with this!

## 2022-09-19 NOTE — Assessment & Plan Note (Signed)
-  BP 178/101, on repeat noted to have systolics at 183 -discontinue lisinopril as I believe patient would benefit from combination therapy, started lisinopril-HCTZ combo 2 tablets daily  -repeat BMP today -instructed to maintain BP log and bring to next visit -reassuringly patient not demonstrating signs of stroke, strict ED precautions discussed -follow up in 1-2 weeks, consider once again repeating BMP at that time to monitor electrolytes and renal function

## 2022-09-19 NOTE — Progress Notes (Signed)
    SUBJECTIVE:   CHIEF COMPLAINT / HPI:   Patient presents for blood pressure follow up. 2 days ago, she had an episode where she experienced a sense of tiredness, fatigue and generalized weakness. Her BP at the time was 160/90. She also experienced dyspnea at the time but denies any chest pain. All these symptoms resolved after 30-45 minutes, improved after she laid down. She has maintained a BP log, average systolic 150-170s with diastolic 80-90s. She has been compliant on her lisinopril daily, tolerating this well.   OBJECTIVE:   BP (!) 178/101   Pulse 88   Wt 194 lb 9.6 oz (88.3 kg)   LMP 05/19/2014   SpO2 99%   BMI 31.41 kg/m   General: Patient well-appearing, in no acute distress. CV: RRR, no murmurs or gallops auscultated Resp: CTAB, no wheezing, rales or rhonchi noted Ext: no LE edema noted bilaterally  Psych: mood appropriate   ASSESSMENT/PLAN:   HTN (hypertension) -BP 178/101, on repeat noted to have systolics at 183 -discontinue lisinopril as I believe patient would benefit from combination therapy, started lisinopril-HCTZ combo 2 tablets daily  -repeat BMP today -instructed to maintain BP log and bring to next visit -reassuringly patient not demonstrating signs of stroke, strict ED precautions discussed -follow up in 1-2 weeks, consider once again repeating BMP at that time to monitor electrolytes and renal function     -PHQ-9 score of 4 with negative question 9 reviewed.    Reece Leader, DO [facility]

## 2022-09-20 ENCOUNTER — Encounter: Payer: Self-pay | Admitting: Family Medicine

## 2022-09-20 LAB — BASIC METABOLIC PANEL
BUN/Creatinine Ratio: 19 (ref 9–23)
BUN: 15 mg/dL (ref 6–24)
CO2: 23 mmol/L (ref 20–29)
Calcium: 9.8 mg/dL (ref 8.7–10.2)
Chloride: 104 mmol/L (ref 96–106)
Creatinine, Ser: 0.78 mg/dL (ref 0.57–1.00)
Glucose: 93 mg/dL (ref 70–99)
Potassium: 4.4 mmol/L (ref 3.5–5.2)
Sodium: 142 mmol/L (ref 134–144)
eGFR: 89 mL/min/{1.73_m2} (ref >59)

## 2022-10-05 ENCOUNTER — Ambulatory Visit (INDEPENDENT_AMBULATORY_CARE_PROVIDER_SITE_OTHER): Payer: Commercial Managed Care - HMO | Admitting: Family Medicine

## 2022-10-05 ENCOUNTER — Encounter: Payer: Self-pay | Admitting: Family Medicine

## 2022-10-05 VITALS — BP 141/72 | HR 87 | Ht 66.0 in | Wt 190.5 lb

## 2022-10-05 DIAGNOSIS — Z23 Encounter for immunization: Secondary | ICD-10-CM | POA: Diagnosis not present

## 2022-10-05 DIAGNOSIS — I10 Essential (primary) hypertension: Secondary | ICD-10-CM | POA: Diagnosis not present

## 2022-10-05 NOTE — Progress Notes (Signed)
    SUBJECTIVE:   CHIEF COMPLAINT / HPI:   Patient with history of hypertension presents for BP follow up. Recently switched to combo medication, endorses compliance with lisinopril-HCTZ daily. Home BP has ranged around systolic 110-140s and diastolics high 60s-high 80s which after a week she switched to one tablet instead of 2 tablets and systolics ranged 100-170s with diastolics high 60s-80 when she would feel dizzy periodically which has subsided since she went down to 1 tablet daily. Denies chest pain, dyspnea, leg swelling or other symptoms.   OBJECTIVE:   BP (!) 141/72   Pulse 87   Ht 5\' 6"  (1.676 m)   Wt 190 lb 8 oz (86.4 kg)   LMP 05/19/2014   SpO2 98%   BMI 30.75 kg/m   General: Patient well-appearing, in no acute distress. CV: RRR, no murmurs or gallops auscultated  Resp: CTAB, no wheezing, rales or rhonchi  Abdomen: soft,  nontender, nondistended, presence of bowel sounds  ASSESSMENT/PLAN:   HTN (hypertension) -BP 143/67, on repeat 141/72 which is close to goal -given episodes of dizziness on 2 tablets of lisinopril-HCTZ, will continue at 1 tablet as BP is appropriate being at goal of <130/80 most of the time -pending BMP to continue to monitor electrolytes and renal function given medication changes -follow up in 1-2 months, plan to get PAP at that time of at patient's earliest convenience as patient politely declines PAP at today's visit    -PHQ-9 score of 4 with negative question 9 reviewed.    07/19/2014, DO [facility]

## 2022-10-05 NOTE — Patient Instructions (Addendum)
It was great seeing you today!  Today we discussed your blood pressure which has improved a lot since the last visit and looks good today. Continue to maintain a log of your blood pressures and bring this to your next visit. For now, please take 1 tablet of the lisinopril-hydrochlorothiazide. Call the clinic if you start to feel dizzy. Your blood pressures otherwise look great. Today we will check your electrolytes and kidney function again, I will let you know of any abnormal results.   If you have chest pain or shortness of breath then please go to the emergency department.   Please follow up at your next scheduled appointment 1-2 months, if anything arises between now and then, please don't hesitate to contact our office.   Thank you for allowing [place] to be a part of your medical care!  Thank you, Dr. Robyne Peers  Also a reminder of our clinic's no-show policy. Please make sure to arrive at least 15 minutes prior to your scheduled appointment time. Please try to cancel before 24 hours if you are not able to make it. If you no-show for 2 appointments then you will be receiving a warning letter. If you no-show after 3 visits, then you may be at risk of being dismissed from our clinic. This is to ensure that everyone is able to be seen in a timely manner. Thank you, we appreciate your assistance with this!

## 2022-10-05 NOTE — Assessment & Plan Note (Signed)
-  BP 143/67, on repeat 141/72 which is close to goal -given episodes of dizziness on 2 tablets of lisinopril-HCTZ, will continue at 1 tablet as BP is appropriate being at goal of <130/80 most of the time -pending BMP to continue to monitor electrolytes and renal function given medication changes -follow up in 1-2 months, plan to get PAP at that time of at patient's earliest convenience as patient politely declines PAP at today's visit

## 2022-10-06 ENCOUNTER — Encounter: Payer: Self-pay | Admitting: Family Medicine

## 2022-10-06 LAB — BASIC METABOLIC PANEL
BUN/Creatinine Ratio: 20 (ref 9–23)
BUN: 17 mg/dL (ref 6–24)
CO2: 26 mmol/L (ref 20–29)
Calcium: 9.7 mg/dL (ref 8.7–10.2)
Chloride: 102 mmol/L (ref 96–106)
Creatinine, Ser: 0.85 mg/dL (ref 0.57–1.00)
Glucose: 101 mg/dL — ABNORMAL HIGH (ref 70–99)
Potassium: 3.6 mmol/L (ref 3.5–5.2)
Sodium: 142 mmol/L (ref 134–144)
eGFR: 80 mL/min/{1.73_m2} (ref >59)

## 2022-11-27 ENCOUNTER — Other Ambulatory Visit: Payer: Self-pay | Admitting: Family Medicine

## 2022-11-27 DIAGNOSIS — E7800 Pure hypercholesterolemia, unspecified: Secondary | ICD-10-CM

## 2022-12-12 ENCOUNTER — Other Ambulatory Visit: Payer: Self-pay | Admitting: Family Medicine

## 2022-12-12 DIAGNOSIS — K219 Gastro-esophageal reflux disease without esophagitis: Secondary | ICD-10-CM

## 2023-01-20 ENCOUNTER — Other Ambulatory Visit: Payer: Self-pay | Admitting: Family Medicine

## 2023-01-20 DIAGNOSIS — I10 Essential (primary) hypertension: Secondary | ICD-10-CM

## 2023-02-05 ENCOUNTER — Other Ambulatory Visit: Payer: Self-pay | Admitting: Family Medicine

## 2023-02-05 DIAGNOSIS — K219 Gastro-esophageal reflux disease without esophagitis: Secondary | ICD-10-CM

## 2023-03-11 ENCOUNTER — Other Ambulatory Visit: Payer: Self-pay | Admitting: Family Medicine

## 2023-03-11 DIAGNOSIS — E7800 Pure hypercholesterolemia, unspecified: Secondary | ICD-10-CM

## 2023-04-08 ENCOUNTER — Other Ambulatory Visit: Payer: Self-pay | Admitting: Family Medicine

## 2023-04-08 DIAGNOSIS — K219 Gastro-esophageal reflux disease without esophagitis: Secondary | ICD-10-CM

## 2023-04-24 ENCOUNTER — Other Ambulatory Visit: Payer: Self-pay

## 2023-04-24 DIAGNOSIS — I10 Essential (primary) hypertension: Secondary | ICD-10-CM

## 2023-04-24 MED ORDER — LISINOPRIL-HYDROCHLOROTHIAZIDE 20-12.5 MG PO TABS: 2.0000 | ORAL_TABLET | Freq: Every day | ORAL | 0 refills | Status: DC

## 2023-05-10 ENCOUNTER — Other Ambulatory Visit: Payer: Self-pay

## 2023-05-10 DIAGNOSIS — K219 Gastro-esophageal reflux disease without esophagitis: Secondary | ICD-10-CM

## 2023-05-10 MED ORDER — OMEPRAZOLE 20 MG PO CPDR: 20.0000 mg | DELAYED_RELEASE_CAPSULE | Freq: Every day | ORAL | 0 refills | Status: DC

## 2023-06-08 ENCOUNTER — Other Ambulatory Visit: Payer: Self-pay | Admitting: Family Medicine

## 2023-06-08 DIAGNOSIS — K219 Gastro-esophageal reflux disease without esophagitis: Secondary | ICD-10-CM

## 2023-06-13 ENCOUNTER — Other Ambulatory Visit: Payer: Self-pay

## 2023-06-13 DIAGNOSIS — E7800 Pure hypercholesterolemia, unspecified: Secondary | ICD-10-CM

## 2023-06-13 MED ORDER — ATORVASTATIN CALCIUM 40 MG PO TABS
40.0000 mg | ORAL_TABLET | Freq: Every day | ORAL | 0 refills | Status: DC
Start: 2023-06-13 — End: 2023-09-18

## 2023-07-03 ENCOUNTER — Ambulatory Visit (INDEPENDENT_AMBULATORY_CARE_PROVIDER_SITE_OTHER): Payer: Commercial Managed Care - HMO | Admitting: Family Medicine

## 2023-07-03 ENCOUNTER — Encounter: Payer: Self-pay | Admitting: Family Medicine

## 2023-07-03 VITALS — BP 118/73 | HR 102 | Ht 66.0 in | Wt 188.0 lb

## 2023-07-03 DIAGNOSIS — Z124 Encounter for screening for malignant neoplasm of cervix: Secondary | ICD-10-CM | POA: Diagnosis not present

## 2023-07-03 DIAGNOSIS — I10 Essential (primary) hypertension: Secondary | ICD-10-CM | POA: Diagnosis not present

## 2023-07-03 DIAGNOSIS — Z0000 Encounter for general adult medical examination without abnormal findings: Secondary | ICD-10-CM

## 2023-07-03 DIAGNOSIS — Z23 Encounter for immunization: Secondary | ICD-10-CM

## 2023-07-03 DIAGNOSIS — Z1231 Encounter for screening mammogram for malignant neoplasm of breast: Secondary | ICD-10-CM | POA: Diagnosis not present

## 2023-07-03 DIAGNOSIS — M9908 Segmental and somatic dysfunction of rib cage: Secondary | ICD-10-CM | POA: Insufficient documentation

## 2023-07-03 NOTE — Progress Notes (Signed)
    SUBJECTIVE:   CHIEF COMPLAINT / HPI:   Traci Guzman is a pleasant 57 year old woman here today for a blood pressure check.  She reports that after being switched from separate lisinopril and HCTZ pills to a combined tablet 2 tablets daily was dropping her blood pressure too low so she switched to taking only 1 tablet/day.  Her blood pressure today is 118/73 which is very well-controlled.  Denies any chest pain, shortness of breath, swelling in her ankles, external dyspnea.  She reports some epigastric tenderness that is exacerbated by slouching and sometimes alleviated by sitting up very straight.  She has had this pain in the past and it was partially relieved by starting omeprazole which she still takes daily.  He describes the pain as a burning tightness that is worst when she bends over for long.'s of time or slouches.  PERTINENT  PMH / PSH: Hypertension, GERD  OBJECTIVE:   BP 118/73   Pulse (!) 102   Ht 5\' 6"  (1.676 m)   Wt 188 lb (85.3 kg)   LMP 05/19/2014   SpO2 100%   BMI 30.34 kg/m   General: A&O, NAD Cardiac: RRR, no m/r/g Respiratory: CTAB, normal WOB, no w/c/r GI: Soft, NTTP, non-distended  Extremities: NTTP, no peripheral edema. OMM: Tissue texture changes to the paraspinal musculature on the left side.  Anterior tender point of rib 10 on the left side.  ASSESSMENT/PLAN:   HTN (hypertension) Blood pressure is optimally controlled on 1 tablet of Zestoretic 20-12.5.  We will continue this dose and recheck her blood pressure in 3 months.  Repeat BMP drawn today.  Follow-up as appropriate.  Somatic dysfunction of costochondral region Performed 1 round of counterstrain on left anterior rib 10 tender point.  50% reduction in pain achieved at today's visit.  Educated patient on how to perform counterstrain on herself at home.  Will repeat the exercise at her next health maintenance visit for Pap smear if necessary.  Routine adult health maintenance Ordered mammogram,  administered flu vaccine.  After discussing patient, agreed to schedule cervical cancer screening for next visit.   Gerrit Heck, DO [facility]

## 2023-07-03 NOTE — Assessment & Plan Note (Signed)
Performed 1 round of counterstrain on left anterior rib 10 tender point.  50% reduction in pain achieved at today's visit.  Educated patient on how to perform counterstrain on herself at home.  Will repeat the exercise at her next health maintenance visit for Pap smear if necessary.

## 2023-07-03 NOTE — Assessment & Plan Note (Signed)
Ordered mammogram, administered flu vaccine.  After discussing patient, agreed to schedule cervical cancer screening for next visit.

## 2023-07-03 NOTE — Assessment & Plan Note (Addendum)
Blood pressure is optimally controlled on 1 tablet of Zestoretic 20-12.5.  We will continue this dose and recheck her blood pressure in 3 months.  Repeat BMP drawn today.  Follow-up as appropriate.

## 2023-07-03 NOTE — Patient Instructions (Signed)
It was wonderful to see you today!  We talked about your blood pressure and your rib pain.  Your blood pressure looks wonderful please continue taking your medication as you were previously, I will change was in our system to reflect the dosage that is working for you.  Today for your rib pain we did a technique called counterstrain which brings her muscles into a very relaxed position and helps them release tension is causing the point tenderness you are feeling on your ribs.  You can continue to do this stretch as I demonstrated for you 2 or 3 times every day.  If your pain gets worse or moves to a new spot please feel free to make another appointment.  I also sent in a referral for mammogram.  Please call the phone number on the handout that I provided to schedule that appointment at your earliest convenience.  I would like to see you back sometime this month to do your Pap exam.  Please schedule that on what ever day is most convenient for you.  Please call [phone] with any questions about today's appointment.   If you need any additional refills, please call your pharmacy before calling the office.  Gerrit Heck, DO Family Medicine

## 2023-07-04 LAB — BASIC METABOLIC PANEL
BUN/Creatinine Ratio: 15 (ref 9–23)
BUN: 17 mg/dL (ref 6–24)
CO2: 21 mmol/L (ref 20–29)
Calcium: 10 mg/dL (ref 8.7–10.2)
Chloride: 103 mmol/L (ref 96–106)
Creatinine, Ser: 1.13 mg/dL — ABNORMAL HIGH (ref 0.57–1.00)
Glucose: 85 mg/dL (ref 70–99)
Potassium: 4 mmol/L (ref 3.5–5.2)
Sodium: 140 mmol/L (ref 134–144)
eGFR: 57 mL/min/{1.73_m2} — ABNORMAL LOW (ref >59)

## 2023-07-04 MED ORDER — LISINOPRIL-HYDROCHLOROTHIAZIDE 20-12.5 MG PO TABS
1.0000 | ORAL_TABLET | Freq: Every day | ORAL | Status: DC
Start: 2023-07-04 — End: 2023-07-24

## 2023-07-04 NOTE — Addendum Note (Signed)
Addended by: Gerrit Heck on: 07/04/2023 12:40 PM   Modules accepted: Orders

## 2023-07-17 ENCOUNTER — Ambulatory Visit: Payer: Commercial Managed Care - HMO | Admitting: Family Medicine

## 2023-07-17 NOTE — Progress Notes (Deleted)
    SUBJECTIVE:   CHIEF COMPLAINT / HPI:   Here for pap. Recheck bmp after rise in creatinine from last visit.   PERTINENT  PMH / PSH: ***  OBJECTIVE:   LMP 05/19/2014   ***  ASSESSMENT/PLAN:   No problem-specific Assessment & Plan notes found for this encounter.     Gerrit Heck, DO [facility]

## 2023-07-19 ENCOUNTER — Other Ambulatory Visit: Payer: Self-pay | Admitting: Family Medicine

## 2023-07-19 DIAGNOSIS — K219 Gastro-esophageal reflux disease without esophagitis: Secondary | ICD-10-CM

## 2023-07-23 ENCOUNTER — Other Ambulatory Visit: Payer: Self-pay | Admitting: Family Medicine

## 2023-07-23 DIAGNOSIS — I10 Essential (primary) hypertension: Secondary | ICD-10-CM

## 2023-09-17 ENCOUNTER — Other Ambulatory Visit: Payer: Self-pay | Admitting: Family Medicine

## 2023-09-17 DIAGNOSIS — I10 Essential (primary) hypertension: Secondary | ICD-10-CM

## 2023-09-17 DIAGNOSIS — E7800 Pure hypercholesterolemia, unspecified: Secondary | ICD-10-CM

## 2023-10-14 ENCOUNTER — Other Ambulatory Visit: Payer: Self-pay | Admitting: Family Medicine

## 2023-10-14 DIAGNOSIS — K219 Gastro-esophageal reflux disease without esophagitis: Secondary | ICD-10-CM

## 2023-12-16 ENCOUNTER — Other Ambulatory Visit: Payer: Self-pay | Admitting: Family Medicine

## 2023-12-16 DIAGNOSIS — E7800 Pure hypercholesterolemia, unspecified: Secondary | ICD-10-CM

## 2024-01-29 ENCOUNTER — Other Ambulatory Visit: Payer: Self-pay | Admitting: Family Medicine

## 2024-01-29 DIAGNOSIS — I10 Essential (primary) hypertension: Secondary | ICD-10-CM

## 2024-03-16 ENCOUNTER — Other Ambulatory Visit: Payer: Self-pay | Admitting: Family Medicine

## 2024-03-16 DIAGNOSIS — E7800 Pure hypercholesterolemia, unspecified: Secondary | ICD-10-CM

## 2024-03-17 ENCOUNTER — Other Ambulatory Visit: Payer: Self-pay | Admitting: Family Medicine

## 2024-03-17 DIAGNOSIS — I10 Essential (primary) hypertension: Secondary | ICD-10-CM

## 2024-03-22 ENCOUNTER — Ambulatory Visit: Admitting: Family Medicine

## 2024-03-22 NOTE — Progress Notes (Deleted)
    SUBJECTIVE:   CHIEF COMPLAINT / HPI:   Pain in arms and stomach Recheck kidney function Pap?  PERTINENT  PMH / PSH: ***  OBJECTIVE:   LMP 05/19/2014   ***  ASSESSMENT/PLAN:   Assessment & Plan      Rayma Calandra, DO [facility]

## 2024-04-23 ENCOUNTER — Ambulatory Visit: Attending: Family Medicine

## 2024-04-23 ENCOUNTER — Ambulatory Visit (HOSPITAL_COMMUNITY)
Admission: RE | Admit: 2024-04-23 | Discharge: 2024-04-23 | Disposition: A | Discharge status: Home / Self Care | Source: Ambulatory Visit | Attending: Family Medicine | Admitting: Family Medicine

## 2024-04-23 ENCOUNTER — Ambulatory Visit: Admitting: Family Medicine

## 2024-04-23 ENCOUNTER — Encounter: Payer: Self-pay | Admitting: Family Medicine

## 2024-04-23 VITALS — BP 135/85 | HR 84 | Ht 66.0 in | Wt 210.2 lb

## 2024-04-23 DIAGNOSIS — R1110 Vomiting, unspecified: Secondary | ICD-10-CM

## 2024-04-23 DIAGNOSIS — R079 Chest pain, unspecified: Secondary | ICD-10-CM | POA: Diagnosis not present

## 2024-04-23 DIAGNOSIS — F32A Depression, unspecified: Secondary | ICD-10-CM | POA: Diagnosis not present

## 2024-04-23 DIAGNOSIS — I10 Essential (primary) hypertension: Secondary | ICD-10-CM

## 2024-04-23 MED ORDER — BUPROPION HCL ER (SR) 100 MG PO TB12: 100.0000 mg | ORAL_TABLET | Freq: Every day | ORAL | 0 refills | Status: DC

## 2024-04-23 NOTE — Assessment & Plan Note (Signed)
 EKG in office today showed normal sinus rhythm, given exertional component as well as arm pain, and as below: - Zio patch, follow-up 2 weeks after placement -TSH to check for thyroid dysfunction -Consider referral to cardiology if symptoms persist

## 2024-04-23 NOTE — Patient Instructions (Addendum)
 It was wonderful to see you today!  Today you were seen for your stomach and arm pain.  Because you have been having palpitations along with your pain I am concerned that your heart may be involved.  I have ordered a ZIO monitor for you to wear for the next 2 weeks to watch her heart rate and look for any underlying arrhythmia.  I have also ordered some lab work to check for any underlying causes of arrhythmias like thyroid problems or anemia.  Once I have all of your results back about 2 weeks I would like to see you back in the office.  If you have trouble placing your Zio patch, please call our office and schedule a nurse visit so that we can help you with placement.  If you have any of the following symptoms please go directly to the emergency department: -Central chest pain that radiates to the arm or jaw - Loss of consciousness - Shortness of breath that does not improve with rest  I have also made a referral to gastroenterology further evaluate your stomach pain.  The referral process usually takes 3 to 4 weeks at which time the the GI office will call you to schedule your initial appointment.  If you have not heard from them by the end of August please call us  so that I can reach out to our referral coordinator.  Please call [phone] with any questions about today's appointment.   If you need any additional refills, please call your pharmacy before calling the office.  Lucie Pinal, DO Family Medicine

## 2024-04-23 NOTE — Assessment & Plan Note (Signed)
 Patient amenable to restarting Wellbutrin  therapy.  Will start with 100 mg daily and increase at next visit if patient is tolerating.

## 2024-04-23 NOTE — Assessment & Plan Note (Signed)
 Routine screening with BMP and CBC

## 2024-04-23 NOTE — Progress Notes (Unsigned)
 EP to read.

## 2024-04-23 NOTE — Progress Notes (Signed)
    SUBJECTIVE:   CHIEF COMPLAINT / HPI:  Presents for acute visit with multiple complaints listed below.  Palpitations-recurred intermittently throughout her adult life but have gotten worse in the last 6 months. [place], felt like her heart was going to stop. Can have palpitations at rest.  Does also have intermittent right arm pain which does not coincide with her palpitations.  Pain is worst when holding her arm directly out in front of her.  No loss of range of motion at this time.  No loss of strength.  Quit smoking in December when her grandson was born. Did use patches for about a month.  She is interested in further smoking cessation counseling.  She has used Wellbutrin  in the past with success and has suffered no ill side effects.  Stomach pain- epigastric, radiates to the left no bloody stools, no coffee ground emesis, nausea or vomiting.  She does occasionally experience a sensation that her food is stuck in her throat while eating, it is not helped by drinking water and when this occurs she does not have to go into the bathroom and regurgitate what she has eaten.  This is never happened to her before.  PERTINENT  PMH / PSH: HTN, HLD, GERD, former tobacco use  OBJECTIVE:   BP 135/85   Pulse 84   Ht 5' 6 (1.676 m)   Wt 210 lb 4 oz (95.4 kg)   LMP 05/19/2014   BMI 33.94 kg/m   General: A&O, NAD HEENT: No sign of trauma, EOM grossly intact Cardiac: RRR, no m/r/g Respiratory: CTAB, normal WOB, no w/c/r GI: Soft, tender to palpation in epigastrum, non-distended  Extremities: NTTP, no peripheral edema.  ASSESSMENT/PLAN:   Assessment & Plan Chest pain, unspecified type EKG in office today showed normal sinus rhythm, given exertional component as well as arm pain, and as below: - Zio patch, follow-up 2 weeks after placement -TSH to check for thyroid dysfunction -Consider referral to cardiology if symptoms persist Primary hypertension Routine  screening with BMP and CBC Depression, unspecified depression type Patient amenable to restarting Wellbutrin  therapy.  Will start with 100 mg daily and increase at next visit if patient is tolerating. Regurgitation of stomach contents Urgent referral to gastroenterology and concern for esophageal stenosis, stricture or achalasia.   Lucie Pinal, DO [facility]

## 2024-04-24 ENCOUNTER — Ambulatory Visit (HOSPITAL_COMMUNITY): Admission: RE | Admit: 2024-04-24 | Source: Ambulatory Visit

## 2024-04-24 ENCOUNTER — Ambulatory Visit: Payer: Self-pay | Admitting: Family Medicine

## 2024-04-24 LAB — CBC WITH DIFFERENTIAL/PLATELET
Basophils Absolute: 0.1 x10E3/uL (ref 0.0–0.2)
Basos: 1 %
EOS (ABSOLUTE): 0.3 x10E3/uL (ref 0.0–0.4)
Eos: 3 %
Hematocrit: 40.5 % (ref 34.0–46.6)
Hemoglobin: 13 g/dL (ref 11.1–15.9)
Immature Grans (Abs): 0 x10E3/uL (ref 0.0–0.1)
Immature Granulocytes: 0 %
Lymphocytes Absolute: 1.3 x10E3/uL (ref 0.7–3.1)
Lymphs: 12 %
MCH: 30.8 pg (ref 26.6–33.0)
MCHC: 32.1 g/dL (ref 31.5–35.7)
MCV: 96 fL (ref 79–97)
Monocytes Absolute: 0.9 x10E3/uL (ref 0.1–0.9)
Monocytes: 8 %
Neutrophils Absolute: 8.3 x10E3/uL — ABNORMAL HIGH (ref 1.4–7.0)
Neutrophils: 76 %
Platelets: 243 x10E3/uL (ref 150–450)
RBC: 4.22 x10E6/uL (ref 3.77–5.28)
RDW: 12.9 % (ref 11.7–15.4)
WBC: 10.9 x10E3/uL — ABNORMAL HIGH (ref 3.4–10.8)

## 2024-04-24 LAB — BASIC METABOLIC PANEL WITH GFR
BUN/Creatinine Ratio: 16 (ref 9–23)
BUN: 16 mg/dL (ref 6–24)
CO2: 22 mmol/L (ref 20–29)
Calcium: 9.8 mg/dL (ref 8.7–10.2)
Chloride: 104 mmol/L (ref 96–106)
Creatinine, Ser: 0.97 mg/dL (ref 0.57–1.00)
Glucose: 106 mg/dL — ABNORMAL HIGH (ref 70–99)
Potassium: 4.5 mmol/L (ref 3.5–5.2)
Sodium: 142 mmol/L (ref 134–144)
eGFR: 68 mL/min/1.73 (ref >59)

## 2024-04-24 LAB — TSH: TSH: 0.753 u[IU]/mL (ref 0.450–4.500)

## 2024-04-25 ENCOUNTER — Encounter: Payer: Self-pay | Admitting: Family Medicine

## 2024-04-29 ENCOUNTER — Other Ambulatory Visit: Payer: Self-pay | Admitting: Family Medicine

## 2024-04-29 DIAGNOSIS — I10 Essential (primary) hypertension: Secondary | ICD-10-CM

## 2024-05-19 ENCOUNTER — Other Ambulatory Visit: Payer: Self-pay | Admitting: Family Medicine

## 2024-05-19 DIAGNOSIS — I10 Essential (primary) hypertension: Secondary | ICD-10-CM

## 2024-05-25 DIAGNOSIS — R001 Bradycardia, unspecified: Secondary | ICD-10-CM | POA: Diagnosis not present

## 2024-05-25 DIAGNOSIS — R079 Chest pain, unspecified: Secondary | ICD-10-CM | POA: Diagnosis not present

## 2024-05-30 ENCOUNTER — Encounter: Payer: Self-pay | Admitting: Family Medicine

## 2024-06-04 ENCOUNTER — Encounter: Payer: Self-pay | Admitting: Family Medicine

## 2024-06-04 ENCOUNTER — Ambulatory Visit: Admitting: Family Medicine

## 2024-06-04 VITALS — BP 102/71 | HR 56 | Ht 66.0 in | Wt 207.4 lb

## 2024-06-04 DIAGNOSIS — M67911 Unspecified disorder of synovium and tendon, right shoulder: Secondary | ICD-10-CM

## 2024-06-04 DIAGNOSIS — R079 Chest pain, unspecified: Secondary | ICD-10-CM | POA: Diagnosis not present

## 2024-06-04 MED ORDER — NAPROXEN 500 MG PO TABS: 500.0000 mg | ORAL_TABLET | Freq: Two times a day (BID) | ORAL | 0 refills | Status: AC

## 2024-06-04 NOTE — Assessment & Plan Note (Signed)
-   Discussed results of Zio patch, provided reassurance that this is most likely related to normal ectopic beats that everyone has.  Patient was reassured, no further treatment required at this time.

## 2024-06-04 NOTE — Patient Instructions (Addendum)
 It was wonderful to see you today!  For your shoulder pain I recommend using Naprosyn  twice daily with meals for 2 weeks, followed by as needed over-the-counter relief.  I would also like you to do the exercises listed in the handout at the end of this packet to help with strengthening and rehabbing your shoulder.  If in 4 weeks your shoulder is still bothering you, please call the office and I will place a referral for you to go in and see sports medicine for a formal ultrasound and possible corticosteroid injection.  Your heart monitor results show that there is no concerning underlying arrhythmia.  The sensation you are having related almost directly to the normal ectopic beats that everyone has.  It just seems that you are more sensitive to your heart rate than other people are.  Please call [phone] with any questions about today's appointment.   If you need any additional refills, please call your pharmacy before calling the office.  Lucie Pinal, DO Family Medicine

## 2024-06-04 NOTE — Progress Notes (Signed)
    SUBJECTIVE:   CHIEF COMPLAINT / HPI:   Zio follow up: No further concerns or episodes of chest pain or sensation of palpitation.  Results showed no concern for underlying arrhythmia, patient's symptoms corresponded primarily to PACs, PVCs and elevated heart rate  Patient also reports some increased pain with motion of the right shoulder.  She cannot recall particular injury or instance where the pain started to occur just that it seems to have flared up.  She did have a prior rotator cuff injury on that side over a year ago at which time she did have a steroid injection with good relief.  PERTINENT  PMH / PSH: Hypertension, fibromyalgia  OBJECTIVE:   BP 102/71   Pulse (!) 56   Ht 5' 6 (1.676 m)   Wt 207 lb 6.4 oz (94.1 kg)   LMP 05/19/2014   SpO2 95%   BMI 33.48 kg/m   General: A&O, NAD Cardiac: RRR, no m/r/g Respiratory: CTAB, normal WOB, no w/c/r Extremities: NTTP, no peripheral edema.  Right shoulder nontender to palpation over [facility] joint and glenohumeral joint.  Full range of motion in extension and abduction on the right. Biceps tendon somewhat tender to palpation over the long head.  Painful resisted internal rotation and external rotation on the right.  Nondiagnostic point-of-care ultrasound shoulder, limited: No obvious tearing noted in the rotator cuff ligaments, some AC joint osteoarthritis changes noted.  Possible calcific tendinopathy of the infraspinatus/subscapularis tendons.   ASSESSMENT/PLAN:   Assessment & Plan Tendinopathy of right rotator cuff - Twice daily Naprosyn  500 mg with meals for 2 weeks -Rehab exercises provided goal of 3 times daily for each exercise -Follow-up in 4 weeks, at that time could consider referral to sports med for diagnostic ultrasound and possible corticosteroid injections Chest pain, unspecified type - Discussed results of Zio patch, provided reassurance that this is most likely related to normal ectopic beats that everyone has.   Patient was reassured, no further treatment required at this time.   Traci Pinal, DO [facility]

## 2024-06-13 ENCOUNTER — Encounter: Payer: Self-pay | Admitting: Family Medicine

## 2024-06-13 ENCOUNTER — Other Ambulatory Visit: Payer: Self-pay | Admitting: Family Medicine

## 2024-06-13 DIAGNOSIS — E7800 Pure hypercholesterolemia, unspecified: Secondary | ICD-10-CM

## 2024-06-15 ENCOUNTER — Other Ambulatory Visit: Payer: Self-pay | Admitting: Family Medicine

## 2024-06-15 DIAGNOSIS — E7800 Pure hypercholesterolemia, unspecified: Secondary | ICD-10-CM

## 2024-09-14 ENCOUNTER — Other Ambulatory Visit: Payer: Self-pay | Admitting: Family Medicine

## 2024-09-14 DIAGNOSIS — E7800 Pure hypercholesterolemia, unspecified: Secondary | ICD-10-CM

## 2024-09-29 ENCOUNTER — Other Ambulatory Visit: Payer: Self-pay | Admitting: Family Medicine

## 2024-09-29 DIAGNOSIS — I10 Essential (primary) hypertension: Secondary | ICD-10-CM

## 2024-11-18 ENCOUNTER — Other Ambulatory Visit: Payer: Self-pay | Admitting: Family Medicine

## 2024-11-18 DIAGNOSIS — I10 Essential (primary) hypertension: Secondary | ICD-10-CM
